# Patient Record
Sex: Male | Born: 1935 | Race: White | Hispanic: No | Smoking: Former smoker
Health system: Southern US, Community
[De-identification: ages and names within clinical notes are randomized; demographics above are authoritative.]

## PROBLEM LIST (undated history)

## (undated) DIAGNOSIS — F419 Anxiety disorder, unspecified: Secondary | ICD-10-CM

## (undated) DIAGNOSIS — H269 Unspecified cataract: Secondary | ICD-10-CM

## (undated) DIAGNOSIS — F329 Major depressive disorder, single episode, unspecified: Secondary | ICD-10-CM

## (undated) DIAGNOSIS — F32A Depression, unspecified: Secondary | ICD-10-CM

## (undated) DIAGNOSIS — I10 Essential (primary) hypertension: Secondary | ICD-10-CM

## (undated) DIAGNOSIS — J439 Emphysema, unspecified: Secondary | ICD-10-CM

## (undated) HISTORY — DX: Major depressive disorder, single episode, unspecified: F32.9

## (undated) HISTORY — PX: LEG SURGERY: SHX1003

## (undated) HISTORY — DX: Anxiety disorder, unspecified: F41.9

## (undated) HISTORY — PX: HIP SURGERY: SHX245

## (undated) HISTORY — DX: Essential (primary) hypertension: I10

## (undated) HISTORY — DX: Depression, unspecified: F32.A

## (undated) HISTORY — PX: HERNIA REPAIR: SHX51

## (undated) HISTORY — DX: Unspecified cataract: H26.9

## (undated) HISTORY — DX: Emphysema, unspecified: J43.9

---

## 1998-05-17 ENCOUNTER — Emergency Department (HOSPITAL_COMMUNITY): Admission: EM | Admit: 1998-05-17 | Discharge: 1998-05-17 | Payer: Self-pay | Admitting: Emergency Medicine

## 1998-05-23 ENCOUNTER — Emergency Department (HOSPITAL_COMMUNITY): Admission: EM | Admit: 1998-05-23 | Discharge: 1998-05-23 | Payer: Self-pay

## 2010-10-15 ENCOUNTER — Emergency Department (INDEPENDENT_AMBULATORY_CARE_PROVIDER_SITE_OTHER): Payer: Medicare Other

## 2010-10-15 ENCOUNTER — Emergency Department (HOSPITAL_BASED_OUTPATIENT_CLINIC_OR_DEPARTMENT_OTHER)
Admission: EM | Admit: 2010-10-15 | Discharge: 2010-10-15 | Disposition: A | Discharge status: Home / Self Care | Payer: Medicare Other | Attending: Emergency Medicine | Admitting: Emergency Medicine

## 2010-10-15 DIAGNOSIS — Z96649 Presence of unspecified artificial hip joint: Secondary | ICD-10-CM

## 2010-10-15 DIAGNOSIS — M25559 Pain in unspecified hip: Secondary | ICD-10-CM | POA: Insufficient documentation

## 2010-10-15 DIAGNOSIS — F17200 Nicotine dependence, unspecified, uncomplicated: Secondary | ICD-10-CM | POA: Insufficient documentation

## 2010-10-15 DIAGNOSIS — W19XXXA Unspecified fall, initial encounter: Secondary | ICD-10-CM

## 2010-10-15 DIAGNOSIS — J438 Other emphysema: Secondary | ICD-10-CM | POA: Insufficient documentation

## 2013-02-21 DIAGNOSIS — H524 Presbyopia: Secondary | ICD-10-CM | POA: Diagnosis not present

## 2013-02-21 DIAGNOSIS — H2510 Age-related nuclear cataract, unspecified eye: Secondary | ICD-10-CM | POA: Diagnosis not present

## 2013-02-21 DIAGNOSIS — H3530 Unspecified macular degeneration: Secondary | ICD-10-CM | POA: Diagnosis not present

## 2014-06-05 ENCOUNTER — Ambulatory Visit (INDEPENDENT_AMBULATORY_CARE_PROVIDER_SITE_OTHER): Payer: Medicare Other | Admitting: Medical

## 2014-06-05 ENCOUNTER — Ambulatory Visit (HOSPITAL_BASED_OUTPATIENT_CLINIC_OR_DEPARTMENT_OTHER)
Admission: RE | Admit: 2014-06-05 | Discharge: 2014-06-05 | Disposition: A | Discharge status: Home / Self Care | Payer: Medicare Other | Source: Ambulatory Visit | Attending: Medical | Admitting: Medical

## 2014-06-05 ENCOUNTER — Encounter: Payer: Self-pay | Admitting: Medical

## 2014-06-05 VITALS — BP 185/85 | HR 71 | Temp 98.2°F | Ht 69.0 in | Wt 121.0 lb

## 2014-06-05 DIAGNOSIS — I10 Essential (primary) hypertension: Secondary | ICD-10-CM | POA: Diagnosis not present

## 2014-06-05 DIAGNOSIS — J438 Other emphysema: Secondary | ICD-10-CM

## 2014-06-05 DIAGNOSIS — Z720 Tobacco use: Secondary | ICD-10-CM | POA: Diagnosis not present

## 2014-06-05 DIAGNOSIS — R918 Other nonspecific abnormal finding of lung field: Secondary | ICD-10-CM | POA: Diagnosis not present

## 2014-06-05 DIAGNOSIS — R0602 Shortness of breath: Secondary | ICD-10-CM | POA: Diagnosis not present

## 2014-06-05 DIAGNOSIS — Z87891 Personal history of nicotine dependence: Secondary | ICD-10-CM

## 2014-06-05 LAB — COMPREHENSIVE METABOLIC PANEL
ALBUMIN: 4.2 g/dL (ref 3.5–5.2)
ALK PHOS: 59 U/L (ref 39–117)
ALT: 10 U/L (ref 0–53)
AST: 20 U/L (ref 0–37)
BILIRUBIN TOTAL: 0.6 mg/dL (ref 0.2–1.2)
BUN: 22 mg/dL (ref 6–23)
CHLORIDE: 105 meq/L (ref 96–112)
CO2: 28 mEq/L (ref 19–32)
CREATININE: 0.96 mg/dL (ref 0.50–1.35)
Calcium: 9.5 mg/dL (ref 8.4–10.5)
Glucose, Bld: 85 mg/dL (ref 70–99)
Potassium: 4.9 mEq/L (ref 3.5–5.3)
SODIUM: 141 meq/L (ref 135–145)
TOTAL PROTEIN: 6.6 g/dL (ref 6.0–8.3)

## 2014-06-05 MED ORDER — BUDESONIDE-FORMOTEROL FUMARATE 160-4.5 MCG/ACT IN AERO: 2.0000 | INHALATION_SPRAY | Freq: Two times a day (BID) | RESPIRATORY_TRACT | Status: DC

## 2014-06-05 MED ORDER — LISINOPRIL 10 MG PO TABS: 10.0000 mg | ORAL_TABLET | Freq: Every day | ORAL | Status: DC

## 2014-06-05 NOTE — Assessment & Plan Note (Signed)
For you blood pressure, I will give lisinopril tabs and get cmp. Wold recommend at home otc cuff. Use and check 3-4 times a week. If at any point neurologic or cardiac symptoms then ED evaluation

## 2014-06-05 NOTE — Progress Notes (Signed)
Pre visit review using our clinic review tool, if applicable. No additional management support is needed unless otherwise documented below in the visit note. 

## 2014-06-05 NOTE — Patient Instructions (Signed)
For you blood pressure, I will give lisinopril tabs and get cmp. Wold recommend at home otc cuff. Use and check 3-4 times a week. If at any point neurologic or cardiac symptoms then ED evaluation  For your copd, I will rx symbicort and get cxr.  Follow up in 2 wks  bp check or as needed.  Follow up 1 month for fasting complete physical. Ask up front staff to give 45 minute appointment.

## 2014-06-05 NOTE — Assessment & Plan Note (Signed)
For your copd, I will rx symbicort and get cxr.

## 2014-06-05 NOTE — Progress Notes (Signed)
Subjective:    Patient ID: STANLY SI, male    DOB: 1928/10/06, 81 y.o.   MRN: [id number]  HPI   I have reviewed pt PMH, PSH, FH, Social History and Surgical History.  Pt in states his bp is elevated. Pt in December had his bp checked and was 170/100. His daughter is with him. She states gradualy increase systolic over the years. He does not go to visit provider  often in the past. Pt feels ok.On extensive review regarding cardiac and neurologic symptoms and denies all. See ROS.  Pt has copd known history. O2 saturation 90%. No significant acute changes recently with the way he breaths. On and off smoking for 60 yrs. He still smokes. 5-10 cigarettes a day.  Years ago period of anxiety and depression. Better now. Denies any years ago.   Pt rum and coke one a day.      Past Medical History  Diagnosis Date  . Anxiety   . Cataract   . Emphysema of lung   . Depression     Years ago not current per patient  . Hypertension     History   Social History  . Marital Status: Divorced    Spouse Name: N/A    Number of Children: N/A  . Years of Education: N/A   Occupational History  . Not on file.   Social History Main Topics  . Smoking status: Current Every Day Smoker  . Smokeless tobacco: Never Used  . Alcohol Use: 0.0 oz/week    0 Not specified per week  . Drug Use: Not on file  . Sexual Activity: No   Other Topics Concern  . Not on file   Social History Narrative  . No narrative on file    Past Surgical History  Procedure Laterality Date  . Leg surgery      Left  . Hip surgery      Joint replacement  . Hernia repair      rt side. year 2000.    Family History  Problem Relation Age of Onset  . GER disease Mother   . Heart disease Father     No Known Allergies  No current outpatient prescriptions on file prior to visit.   No current facility-administered medications on file prior to visit.    BP 185/85 mmHg  Pulse 71  Temp(Src) 98.2 F (36.8  C) (Oral)  Ht  (1.753 m)  Wt 121 lb (54.885 kg)  BMI 17.86 kg/m2  SpO2 90%        Review of Systems  Constitutional: Negative for fever, chills, diaphoresis, activity change and fatigue.  HENT: Negative for congestion, ear discharge, facial swelling, mouth sores, nosebleeds, postnasal drip and rhinorrhea.   Respiratory: Positive for shortness of breath. Negative for cough and chest tightness.        But baseline.  Cardiovascular: Negative for chest pain, palpitations and leg swelling.  Gastrointestinal: Negative for nausea, vomiting and abdominal pain.  Musculoskeletal: Negative for back pain, joint swelling, arthralgias, neck pain and neck stiffness.  Skin: Negative for pallor and rash.  Neurological: Negative for dizziness, tremors, seizures, syncope, facial asymmetry, speech difficulty, weakness, light-headedness, numbness and headaches.  Psychiatric/Behavioral: Negative for suicidal ideas, behavioral problems, confusion, sleep disturbance, dysphoric mood, decreased concentration and agitation. The patient is not nervous/anxious.        Objective:   Physical Exam   General Mental Status- Alert. General Appearance- Not in acute distress.   Skin General: Color-  Normal Color. Moisture- Normal Moisture.  Neck Carotid Arteries- Normal color. Moisture- Normal Moisture. No carotid bruits. No JVD.  Chest and Lung Exam Auscultation: Breath Sounds:- even and unlabored but shallow  Cardiovascular Auscultation:Rythm- Regular. Murmurs & Other Heart Sounds:Auscultation of the heart reveals- No Murmurs.  Abdomen Inspection:-Inspeection Normal. Palpation/Percussion:Note:No mass. Palpation and Percussion of the abdomen reveal- Non Tender, Non Distended + BS, no rebound or guarding.    Neurologic Cranial Nerve exam:- CN III-XII intact(No nystagmus), symmetric smile. Drift Test:- No drift. Romberg Exam:- Negative.  Heal to Toe Gait exam:-Normal. Finger to Nose:-  Normal/Intact Strength:- 5/5 equal and symmetric strength both upper and lower extremities.       Assessment & Plan:

## 2014-06-07 ENCOUNTER — Telehealth: Payer: Self-pay | Admitting: Medical

## 2014-06-07 MED ORDER — CLARITHROMYCIN ER 500 MG PO TB24: 1000.0000 mg | ORAL_TABLET | Freq: Every day | ORAL | Status: DC

## 2014-06-07 NOTE — Telephone Encounter (Signed)
Send antibiotic in due to abnormal opacity.

## 2014-06-09 ENCOUNTER — Other Ambulatory Visit: Payer: Self-pay

## 2014-06-09 MED ORDER — CLARITHROMYCIN ER 500 MG PO TB24: 1000.0000 mg | ORAL_TABLET | Freq: Every day | ORAL | Status: DC

## 2014-06-24 ENCOUNTER — Encounter: Payer: Self-pay | Admitting: Medical

## 2014-06-24 ENCOUNTER — Ambulatory Visit (INDEPENDENT_AMBULATORY_CARE_PROVIDER_SITE_OTHER): Payer: Medicare Other | Admitting: Medical

## 2014-06-24 ENCOUNTER — Ambulatory Visit (HOSPITAL_BASED_OUTPATIENT_CLINIC_OR_DEPARTMENT_OTHER)
Admission: RE | Admit: 2014-06-24 | Discharge: 2014-06-24 | Disposition: A | Discharge status: Home / Self Care | Payer: Medicare Other | Source: Ambulatory Visit | Attending: Medical | Admitting: Medical

## 2014-06-24 VITALS — BP 160/80 | HR 77 | Temp 97.6°F | Ht 69.0 in | Wt 125.2 lb

## 2014-06-24 DIAGNOSIS — J438 Other emphysema: Secondary | ICD-10-CM | POA: Diagnosis not present

## 2014-06-24 DIAGNOSIS — J189 Pneumonia, unspecified organism: Secondary | ICD-10-CM | POA: Diagnosis not present

## 2014-06-24 DIAGNOSIS — I10 Essential (primary) hypertension: Secondary | ICD-10-CM | POA: Diagnosis not present

## 2014-06-24 DIAGNOSIS — R918 Other nonspecific abnormal finding of lung field: Secondary | ICD-10-CM | POA: Diagnosis not present

## 2014-06-24 NOTE — Progress Notes (Signed)
Subjective:    Patient ID: Tom Duarte, male    DOB: 03-Mar-1929, 81 y.o.   MRN: [id number]  HPI    Pt in for follow up. His blood pressure is better. Last time was 185/85 last time initial check with my nurse was 160/80. No cardiac or neurologic signs or symptoms. No side effects from bp med.   Pt states his breathing stable. No wheezing or sob today. Pt was given symbicort on last visit.   Pt on last visit had cxr. He had abnormal opacity. I rx'd biaxin and he took. We will repeat cxr today. Pt denies fevers, chills or sweats. Occasional productive cough. Pt still smokes 5-7 cigarettes a day. Long term smoker.       Review of Systems  Constitutional: Negative for fever, chills, diaphoresis, activity change and fatigue.  Respiratory: Positive for cough. Negative for chest tightness and shortness of breath.        Rare occasional minimal cough. Minimally productive.   Cardiovascular: Negative for chest pain, palpitations and leg swelling.  Gastrointestinal: Negative for nausea, vomiting and abdominal pain.  Musculoskeletal: Negative for neck pain and neck stiffness.  Neurological: Negative for dizziness, tremors, seizures, syncope, facial asymmetry, speech difficulty, weakness, light-headedness, numbness and headaches.  Psychiatric/Behavioral: Negative for behavioral problems, confusion and agitation. The patient is not nervous/anxious.     Past Medical History  Diagnosis Date  . Anxiety   . Cataract   . Emphysema of lung   . Depression     Years ago not current per patient  . Hypertension     History   Social History  . Marital Status: Divorced    Spouse Name: N/A    Number of Children: N/A  . Years of Education: N/A   Occupational History  . Not on file.   Social History Main Topics  . Smoking status: Current Every Day Smoker  . Smokeless tobacco: Never Used  . Alcohol Use: 0.0 oz/week    0 Not specified per week  . Drug Use: Not on file  . Sexual  Activity: No   Other Topics Concern  . Not on file   Social History Narrative    Past Surgical History  Procedure Laterality Date  . Leg surgery      Left  . Hip surgery      Joint replacement  . Hernia repair      rt side. year 2000.    Family History  Problem Relation Age of Onset  . GER disease Mother   . Heart disease Father     No Known Allergies  Current Outpatient Prescriptions on File Prior to Visit  Medication Sig Dispense Refill  . aspirin 325 MG tablet Take 325 mg by mouth daily. 2 daily    . budesonide-formoterol (SYMBICORT) 160-4.5 MCG/ACT inhaler Inhale 2 puffs into the lungs 2 (two) times daily. 1 Inhaler 3  . clarithromycin (BIAXIN XL) 500 MG 24 hr tablet Take 2 tablets (1,000 mg total) by mouth daily. 20 tablet 0  . clarithromycin (BIAXIN XL) 500 MG 24 hr tablet Take 2 tablets (1,000 mg total) by mouth daily. 20 tablet 0  . lisinopril (PRINIVIL,ZESTRIL) 10 MG tablet Take 1 tablet (10 mg total) by mouth daily. 30 tablet 1   No current facility-administered medications on file prior to visit.    BP 160/80 mmHg  Pulse 77  Temp(Src) 97.6 F (36.4 C) (Oral)  Ht  (1.753 m)  Wt 125 lb 3.2 oz (56.79  kg)  BMI 18.48 kg/m2  SpO2 97%       Objective:   Physical Exam   General Mental Status- Alert. General Appearance- Not in acute distress.   Skin General: Color- Normal Color. Moisture- Normal Moisture.  Neck Carotid Arteries- Normal color. Moisture- Normal Moisture. No carotid bruits. No JVD.  Chest and Lung Exam Auscultation: Breath Sounds:-even unlabored but shallow respirations.  Cardiovascular Auscultation:Rythm- Regular. Murmurs & Other Heart Sounds:Auscultation of the heart reveals- No Murmurs.   Neurologic Cranial Nerve exam:- CN III-XII intact(No nystagmus), symmetric smile. Drift Test:- No drift. Romberg Exam:- Negative.  Heal to Toe Gait exam:-Normal. Finger to Nose:- Normal/Intact Strength:- 5/5 equal and symmetric  strength both upper and lower extremities.       Assessment & Plan:

## 2014-06-24 NOTE — Progress Notes (Signed)
Pre visit review using our clinic review tool, if applicable. No additional management support is needed unless otherwise documented below in the visit note. 

## 2014-06-24 NOTE — Assessment & Plan Note (Signed)
By cxr recent pneumonia. He has been on biaxin. And will repeat cxr today to see if opacity has cleared. If area has not cleared then will get ct of the chest.

## 2014-06-24 NOTE — Assessment & Plan Note (Signed)
Stable and continue symbicrot.

## 2014-06-24 NOTE — Assessment & Plan Note (Addendum)
Your blood pressure is better today. I think it would be beneficial for you to get otc bp cuff. Check bp daily and give us update in 3 wks regarding the readings. I want to know the trend on daily basis to determine if we need to increase dose of medication.   Follow up in 3 weeks.

## 2014-06-24 NOTE — Patient Instructions (Signed)
HTN (hypertension) Your blood pressure is better today. I think it would be beneficial for you to get otc bp cuff. Check bp daily and give us update in 3 wks regarding the readings. I want to know the trend on daily basis to determine if we need to increase dose of medication.   Follow up in 3 weeks.     Other emphysema Stable and continue symbicrot.   CAP (community acquired pneumonia) By cxr recent pneumonia. He has been on biaxin. And will repeat cxr today to see if opacity has cleared. If area has not cleared then will get ct of the chest.    If any cardiac or neurologic signs or symptoms then ED evaluation.

## 2014-06-25 ENCOUNTER — Telehealth: Payer: Self-pay | Admitting: Medical

## 2014-06-25 DIAGNOSIS — R918 Other nonspecific abnormal finding of lung field: Secondary | ICD-10-CM

## 2014-06-25 DIAGNOSIS — F17200 Nicotine dependence, unspecified, uncomplicated: Secondary | ICD-10-CM

## 2014-06-25 NOTE — Telephone Encounter (Signed)
Appt scheduled for 2/12

## 2014-06-25 NOTE — Telephone Encounter (Signed)
Advised patient of repeat CXR and results. Told order placed for CT of Chest. States he will come and have done.

## 2014-06-25 NOTE — Telephone Encounter (Signed)
Lung opacity is unchanged. So will order ct per radiologist recommendation.

## 2014-06-27 ENCOUNTER — Ambulatory Visit (HOSPITAL_BASED_OUTPATIENT_CLINIC_OR_DEPARTMENT_OTHER): Admission: RE | Admit: 2014-06-27 | Payer: Medicare Other | Source: Ambulatory Visit

## 2014-06-30 ENCOUNTER — Other Ambulatory Visit: Payer: Self-pay | Admitting: Family Medicine

## 2014-06-30 ENCOUNTER — Ambulatory Visit (HOSPITAL_BASED_OUTPATIENT_CLINIC_OR_DEPARTMENT_OTHER)
Admission: RE | Admit: 2014-06-30 | Discharge: 2014-06-30 | Disposition: A | Discharge status: Home / Self Care | Payer: Medicare Other | Source: Ambulatory Visit | Attending: Medical | Admitting: Medical

## 2014-06-30 DIAGNOSIS — R918 Other nonspecific abnormal finding of lung field: Secondary | ICD-10-CM

## 2014-06-30 DIAGNOSIS — F17200 Nicotine dependence, unspecified, uncomplicated: Secondary | ICD-10-CM

## 2014-06-30 DIAGNOSIS — J439 Emphysema, unspecified: Secondary | ICD-10-CM | POA: Diagnosis not present

## 2014-06-30 DIAGNOSIS — R911 Solitary pulmonary nodule: Secondary | ICD-10-CM | POA: Insufficient documentation

## 2014-07-01 ENCOUNTER — Telehealth: Payer: Self-pay | Admitting: Medical

## 2014-07-01 NOTE — Telephone Encounter (Signed)
I called pt and no voice mail box set up. So could not leave a message for pt. Only one number to call.

## 2014-08-13 ENCOUNTER — Ambulatory Visit (INDEPENDENT_AMBULATORY_CARE_PROVIDER_SITE_OTHER): Payer: Medicare Other | Admitting: Medical

## 2014-08-13 ENCOUNTER — Telehealth: Payer: Self-pay | Admitting: Medical

## 2014-08-13 ENCOUNTER — Encounter: Payer: Self-pay | Admitting: Medical

## 2014-08-13 VITALS — BP 145/88 | HR 64 | Temp 97.7°F | Ht 69.0 in | Wt 124.4 lb

## 2014-08-13 DIAGNOSIS — I10 Essential (primary) hypertension: Secondary | ICD-10-CM

## 2014-08-13 DIAGNOSIS — J438 Other emphysema: Secondary | ICD-10-CM | POA: Diagnosis not present

## 2014-08-13 MED ORDER — LISINOPRIL 10 MG PO TABS: 10.0000 mg | ORAL_TABLET | Freq: Every day | ORAL | Status: DC

## 2014-08-13 NOTE — Progress Notes (Signed)
Subjective:    Patient ID: Tom Duarte, male    DOB: 06/22/1928, 81 y.o.   MRN: [id number]  HPI   Pt in for follow up. Pt states he bought a machine and readings that his daughter go were 136/63 and 141/55. Pt ran out of blood pressure medicine this weekend. 4 days since he ran out of medicine lisinopril. No cardiac or neuro symptoms. Initially bp readings by nurse were 172/82 and 167/83. Then came down to 154/90 on last lpn bp check.  Pt breathing recently stable. He is on symbicort. Hx of copd.    Review of Systems  Constitutional: Negative for fever, chills, diaphoresis, activity change and fatigue.  Respiratory: Negative for cough, chest tightness and shortness of breath.   Cardiovascular: Negative for chest pain, palpitations and leg swelling.  Gastrointestinal: Negative for nausea, vomiting and abdominal pain.  Musculoskeletal: Negative for neck pain and neck stiffness.  Neurological: Negative for dizziness, tremors, seizures, syncope, facial asymmetry, speech difficulty, weakness, light-headedness, numbness and headaches.  Psychiatric/Behavioral: Negative for behavioral problems, confusion and agitation. The patient is not nervous/anxious.    Past Medical History  Diagnosis Date  . Anxiety   . Cataract   . Emphysema of lung   . Depression     Years ago not current per patient  . Hypertension     History   Social History  . Marital Status: Divorced    Spouse Name: N/A  . Number of Children: N/A  . Years of Education: N/A   Occupational History  . Not on file.   Social History Main Topics  . Smoking status: Current Every Day Smoker  . Smokeless tobacco: Never Used  . Alcohol Use: 0.0 oz/week    0 Standard drinks or equivalent per week  . Drug Use: Not on file  . Sexual Activity: No   Other Topics Concern  . Not on file   Social History Narrative    Past Surgical History  Procedure Laterality Date  . Leg surgery      Left  . Hip surgery     Joint replacement  . Hernia repair      rt side. year 2000.    Family History  Problem Relation Age of Onset  . GER disease Mother   . Heart disease Father     No Known Allergies  Current Outpatient Prescriptions on File Prior to Visit  Medication Sig Dispense Refill  . aspirin 325 MG tablet Take 325 mg by mouth daily. 2 daily    . budesonide-formoterol (SYMBICORT) 160-4.5 MCG/ACT inhaler Inhale 2 puffs into the lungs 2 (two) times daily. 1 Inhaler 3  . lisinopril (PRINIVIL,ZESTRIL) 10 MG tablet Take 1 tablet (10 mg total) by mouth daily. 30 tablet 1   No current facility-administered medications on file prior to visit.    BP 145/88 mmHg  Pulse 64  Temp(Src) 97.7 F (36.5 C) (Oral)  Ht  (1.753 m)  Wt 124 lb 6.4 oz (56.427 kg)  BMI 18.36 kg/m2  SpO2 100%       Objective:   Physical Exam  General Mental Status- Alert. General Appearance- Not in acute distress.   Skin General: Color- Normal Color. Moisture- Normal Moisture.  Neck Carotid Arteries- Normal color. Moisture- Normal Moisture. No carotid bruits. No JVD.  Chest and Lung Exam Auscultation: Breath Sounds:-Normal. CTA.  Cardiovascular Auscultation:Rythm- Regular, Rate, Rhythm. Murmurs & Other Heart Sounds:Auscultation of the heart reveals- No Murmurs.  Abdomen Inspection:-Inspeection Normal. Palpation/Percussion:Note:No mass. Palpation  and Percussion of the abdomen reveal- Non Tender, Non Distended + BS, no rebound or guarding.    Neurologic Cranial Nerve exam:- CN III-XII intact(No nystagmus), symmetric smile. Drift Test:- No drift. Romberg Exam:- Negative.  Heal to Toe Gait exam:-Normal. Finger to Nose:- Normal/Intact Strength:- 5/5 equal and symmetric strength both upper and lower extremities.      Assessment & Plan:

## 2014-08-13 NOTE — Telephone Encounter (Signed)
emmi mailed  °

## 2014-08-13 NOTE — Assessment & Plan Note (Signed)
Pt bp little high today when I checked. High on initial readings with lpn. Pt is off of meds. Refilled his lisinopril today.

## 2014-08-13 NOTE — Progress Notes (Signed)
Pre visit review using our clinic review tool, if applicable. No additional management support is needed unless otherwise documented below in the visit note. 

## 2014-08-13 NOTE — Patient Instructions (Addendum)
HTN (hypertension) Pt bp little high today when I checked. High on initial readings with lpn. Pt is off of meds. Refilled his lisinopril today.       Other emphysema Pt has symbicort and doing well with. Breathing stable. Continue.     Follow up in 3 months for wellness exam.Please schedule a 45 minute appointment and in the morning. Come in fasting

## 2014-08-13 NOTE — Assessment & Plan Note (Signed)
Pt has symbicort and doing well with. Breathing stable. Continue.

## 2015-01-12 ENCOUNTER — Telehealth: Payer: Self-pay | Admitting: Medical

## 2015-01-12 DIAGNOSIS — R911 Solitary pulmonary nodule: Secondary | ICD-10-CM

## 2015-01-12 NOTE — Telephone Encounter (Signed)
Ct chest for lung nodule.

## 2015-02-04 ENCOUNTER — Ambulatory Visit (HOSPITAL_BASED_OUTPATIENT_CLINIC_OR_DEPARTMENT_OTHER)
Admission: RE | Admit: 2015-02-04 | Discharge: 2015-02-04 | Disposition: A | Discharge status: Home / Self Care | Payer: Medicare Other | Source: Ambulatory Visit | Attending: Medical | Admitting: Medical

## 2015-02-04 DIAGNOSIS — R911 Solitary pulmonary nodule: Secondary | ICD-10-CM

## 2015-02-04 DIAGNOSIS — J439 Emphysema, unspecified: Secondary | ICD-10-CM | POA: Diagnosis not present

## 2015-02-04 DIAGNOSIS — I2510 Atherosclerotic heart disease of native coronary artery without angina pectoris: Secondary | ICD-10-CM | POA: Insufficient documentation

## 2015-02-05 ENCOUNTER — Telehealth: Payer: Self-pay | Admitting: Medical

## 2015-02-05 NOTE — Telephone Encounter (Signed)
Printed a copy of the results and will try and talk to pt.

## 2015-02-05 NOTE — Telephone Encounter (Signed)
Pt called back. He said not to call. He is going to stop in today for info from you. He said he has time to wait and he'll talk to you face to face.

## 2015-02-05 NOTE — Telephone Encounter (Signed)
Pt came in office and talked with provider.

## 2015-03-03 ENCOUNTER — Telehealth: Payer: Self-pay | Admitting: Medical

## 2015-03-03 NOTE — Telephone Encounter (Signed)
Caller name: Onalee HuaDavid Relation to HQ:IONGpt:self Call back number: [phone] Pharmacy:[id number] [address] [address] - [address]  Reason for call:  Pt came in office stating has an appt for Mar 18, 2015 and is taking med Lisinopril 10 mg 1 tablet every day and is wanting to know if he is needing to continue meds until coming to appt, if so he is going to need it to be refilled. Please advise.

## 2015-03-04 MED ORDER — LISINOPRIL 10 MG PO TABS: 10.0000 mg | ORAL_TABLET | Freq: Every day | ORAL | Status: DC

## 2015-03-04 NOTE — Telephone Encounter (Signed)
Pt will need to stay on medication from previous note.

## 2015-03-18 ENCOUNTER — Ambulatory Visit (INDEPENDENT_AMBULATORY_CARE_PROVIDER_SITE_OTHER): Payer: Medicare Other | Admitting: Medical

## 2015-03-18 ENCOUNTER — Encounter: Payer: Self-pay | Admitting: Medical

## 2015-03-18 VITALS — BP 126/78 | HR 84 | Temp 97.7°F | Ht 69.0 in | Wt 114.0 lb

## 2015-03-18 DIAGNOSIS — I10 Essential (primary) hypertension: Secondary | ICD-10-CM | POA: Diagnosis not present

## 2015-03-18 DIAGNOSIS — E875 Hyperkalemia: Secondary | ICD-10-CM | POA: Diagnosis not present

## 2015-03-18 DIAGNOSIS — J438 Other emphysema: Secondary | ICD-10-CM | POA: Diagnosis not present

## 2015-03-18 MED ORDER — LISINOPRIL 10 MG PO TABS: 10.0000 mg | ORAL_TABLET | Freq: Every day | ORAL | Status: DC

## 2015-03-18 NOTE — Progress Notes (Signed)
Pre visit review using our clinic review tool, if applicable. No additional management support is needed unless otherwise documented below in the visit note. 

## 2015-03-18 NOTE — Patient Instructions (Signed)
Blood pressure is well controlled. Will refill his lisinopril. Will get cbc and cmp.  For your history of smoking, hx of copd and pulmonary htn reported likely on ct report, I will refer you to pulmonologist.  Follow up in 4 months or as needed.

## 2015-03-18 NOTE — Progress Notes (Signed)
Subjective:    Patient ID: Tom RossettiDavid S Duarte, male    DOB: 1929/03/17, 81 y.o.   MRN: [id number]  HPI  Pt blood pressure is good today. Pt blood pressure best it has been.  Pt has no cardiac or neurologic signs or symptoms.   No side effects from bp medications  Last cmp in January showed normal lab.  Pt states he only took symbicort one time. He states it did not work so he stopped. Pt long history of smoking. Pt smokes pack every 2 days. He started smoking in early 20's.  Pt had some emphysema by ct. Some pulmonary htn probable on ct report.     Review of Systems  Constitutional: Negative for fever, chills, diaphoresis, activity change and fatigue.  Respiratory: Negative for cough, chest tightness and shortness of breath.   Cardiovascular: Negative for chest pain, palpitations and leg swelling.  Gastrointestinal: Negative for nausea, vomiting and abdominal pain.  Musculoskeletal: Negative for neck pain and neck stiffness.  Neurological: Negative for dizziness, tremors, seizures, syncope, facial asymmetry, speech difficulty, weakness, light-headedness, numbness and headaches.  Psychiatric/Behavioral: Negative for behavioral problems, confusion and agitation. The patient is not nervous/anxious.    Past Medical History  Diagnosis Date  . Anxiety   . Cataract   . Emphysema of lung (HCC)   . Depression     Years ago not current per patient  . Hypertension     Social History   Social History  . Marital Status: Divorced    Spouse Name: N/A  . Number of Children: N/A  . Years of Education: N/A   Occupational History  . Not on file.   Social History Main Topics  . Smoking status: Current Every Day Smoker  . Smokeless tobacco: Never Used  . Alcohol Use: 0.0 oz/week    0 Standard drinks or equivalent per week  . Drug Use: Not on file  . Sexual Activity: No   Other Topics Concern  . Not on file   Social History Narrative    Past Surgical History  Procedure  Laterality Date  . Leg surgery      Left  . Hip surgery      Joint replacement  . Hernia repair      rt side. year 2000.    Family History  Problem Relation Age of Onset  . GER disease Mother   . Heart disease Father     No Known Allergies  Current Outpatient Prescriptions on File Prior to Visit  Medication Sig Dispense Refill  . aspirin 325 MG tablet Take 325 mg by mouth daily. 2 daily    . lisinopril (PRINIVIL,ZESTRIL) 10 MG tablet Take 1 tablet (10 mg total) by mouth daily. 30 tablet 0   No current facility-administered medications on file prior to visit.    BP 126/78 mmHg  Pulse 84  Temp(Src) 97.7 F (36.5 C) (Oral)  Ht 5\' 9"  (1.753 m)  Wt 114 lb (51.71 kg)  BMI 16.83 kg/m2  SpO2 97%       Objective:   Physical Exam  General Mental Status- Alert. General Appearance- Not in acute distress.   Skin General: Color- Normal Color. Moisture- Normal Moisture.  Neck Carotid Arteries- Normal color. Moisture- Normal Moisture. No carotid bruits. No JVD.  Chest and Lung Exam Auscultation: Breath Sounds:-Normal.  Cardiovascular Auscultation:Rythm- Regular. Murmurs & Other Heart Sounds:Auscultation of the heart reveals- No Murmurs.  Abdomen Inspection:-Inspeection Normal. Palpation/Percussion:Note:No mass. Palpation and Percussion of the abdomen reveal- Non Tender,  Non Distended + BS, no rebound or guarding.    Neurologic Cranial Nerve exam:- CN III-XII intact(No nystagmus), symmetric smile. Strength:- 5/5 equal and symmetric strength both upper and lower extremities.      Assessment & Plan:  Blood pressure is well controlled. Will refill his lisinopril. Will get cbc and cmp.  For your history of smoking, hx of copd and pulmonary htn reported likely on ct report, I will refer you to pulmonologist.  Follow up in 4 months or as needed.

## 2015-03-19 LAB — CBC WITH DIFFERENTIAL/PLATELET
BASOS PCT: 1.2 % (ref 0.0–3.0)
Basophils Absolute: 0.1 10*3/uL (ref 0.0–0.1)
EOS ABS: 0.3 10*3/uL (ref 0.0–0.7)
Eosinophils Relative: 3.6 % (ref 0.0–5.0)
HEMATOCRIT: 41 % (ref 39.0–52.0)
Hemoglobin: 13.6 g/dL (ref 13.0–17.0)
LYMPHS ABS: 1.6 10*3/uL (ref 0.7–4.0)
LYMPHS PCT: 19.6 % (ref 12.0–46.0)
MCHC: 33.2 g/dL (ref 30.0–36.0)
MCV: 91.9 fl (ref 78.0–100.0)
MONO ABS: 0.5 10*3/uL (ref 0.1–1.0)
Monocytes Relative: 5.7 % (ref 3.0–12.0)
NEUTROS ABS: 5.7 10*3/uL (ref 1.4–7.7)
Neutrophils Relative %: 69.9 % (ref 43.0–77.0)
PLATELETS: 281 10*3/uL (ref 150.0–400.0)
RBC: 4.46 Mil/uL (ref 4.22–5.81)
RDW: 14.5 % (ref 11.5–15.5)
WBC: 8.1 10*3/uL (ref 4.0–10.5)

## 2015-03-19 LAB — COMPREHENSIVE METABOLIC PANEL
ALT: 10 U/L (ref 0–53)
AST: 18 U/L (ref 0–37)
Albumin: 4.3 g/dL (ref 3.5–5.2)
Alkaline Phosphatase: 51 U/L (ref 39–117)
BUN: 23 mg/dL (ref 6–23)
CALCIUM: 10 mg/dL (ref 8.4–10.5)
CHLORIDE: 103 meq/L (ref 96–112)
CO2: 30 meq/L (ref 19–32)
CREATININE: 1.02 mg/dL (ref 0.40–1.50)
GFR: 73.95 mL/min (ref >60.00)
Glucose, Bld: 91 mg/dL (ref 70–99)
Potassium: 5.3 mEq/L — ABNORMAL HIGH (ref 3.5–5.1)
Sodium: 141 mEq/L (ref 135–145)
Total Bilirubin: 0.6 mg/dL (ref 0.2–1.2)
Total Protein: 7.2 g/dL (ref 6.0–8.3)

## 2015-03-20 NOTE — Addendum Note (Signed)
Addended by: Neldon LabellaMABE, HOLDEN S on: 03/20/2015 01:20 PM   Modules accepted: Orders

## 2015-12-30 ENCOUNTER — Encounter: Payer: Self-pay | Admitting: Medical

## 2015-12-30 ENCOUNTER — Ambulatory Visit (HOSPITAL_BASED_OUTPATIENT_CLINIC_OR_DEPARTMENT_OTHER)
Admission: RE | Admit: 2015-12-30 | Discharge: 2015-12-30 | Disposition: A | Discharge status: Home / Self Care | Payer: Medicare Other | Source: Ambulatory Visit | Attending: Medical | Admitting: Medical

## 2015-12-30 ENCOUNTER — Ambulatory Visit (INDEPENDENT_AMBULATORY_CARE_PROVIDER_SITE_OTHER): Payer: Medicare Other | Admitting: Medical

## 2015-12-30 ENCOUNTER — Telehealth: Payer: Self-pay | Admitting: Medical

## 2015-12-30 VITALS — BP 98/60 | HR 78 | Temp 97.8°F | Ht 69.0 in | Wt 109.4 lb

## 2015-12-30 DIAGNOSIS — I77810 Thoracic aortic ectasia: Secondary | ICD-10-CM | POA: Diagnosis not present

## 2015-12-30 DIAGNOSIS — R911 Solitary pulmonary nodule: Secondary | ICD-10-CM | POA: Insufficient documentation

## 2015-12-30 DIAGNOSIS — I10 Essential (primary) hypertension: Secondary | ICD-10-CM

## 2015-12-30 DIAGNOSIS — J948 Other specified pleural conditions: Secondary | ICD-10-CM | POA: Diagnosis not present

## 2015-12-30 DIAGNOSIS — F329 Major depressive disorder, single episode, unspecified: Secondary | ICD-10-CM | POA: Diagnosis not present

## 2015-12-30 DIAGNOSIS — J432 Centrilobular emphysema: Secondary | ICD-10-CM | POA: Diagnosis not present

## 2015-12-30 DIAGNOSIS — F411 Generalized anxiety disorder: Secondary | ICD-10-CM

## 2015-12-30 DIAGNOSIS — F32A Depression, unspecified: Secondary | ICD-10-CM

## 2015-12-30 DIAGNOSIS — J441 Chronic obstructive pulmonary disease with (acute) exacerbation: Secondary | ICD-10-CM | POA: Diagnosis not present

## 2015-12-30 LAB — CBC WITH DIFFERENTIAL/PLATELET
BASOS ABS: 0.1 10*3/uL (ref 0.0–0.1)
BASOS PCT: 0.6 % (ref 0.0–3.0)
EOS ABS: 0.1 10*3/uL (ref 0.0–0.7)
Eosinophils Relative: 1 % (ref 0.0–5.0)
HEMATOCRIT: 39.7 % (ref 39.0–52.0)
HEMOGLOBIN: 13.3 g/dL (ref 13.0–17.0)
LYMPHS PCT: 16.5 % (ref 12.0–46.0)
Lymphs Abs: 1.3 10*3/uL (ref 0.7–4.0)
MCHC: 33.4 g/dL (ref 30.0–36.0)
MCV: 90.3 fl (ref 78.0–100.0)
MONOS PCT: 5.8 % (ref 3.0–12.0)
Monocytes Absolute: 0.5 10*3/uL (ref 0.1–1.0)
Neutro Abs: 6.1 10*3/uL (ref 1.4–7.7)
Neutrophils Relative %: 76.1 % (ref 43.0–77.0)
Platelets: 324 10*3/uL (ref 150.0–400.0)
RBC: 4.4 Mil/uL (ref 4.22–5.81)
RDW: 14 % (ref 11.5–15.5)
WBC: 8 10*3/uL (ref 4.0–10.5)

## 2015-12-30 LAB — COMPREHENSIVE METABOLIC PANEL
ALBUMIN: 4.5 g/dL (ref 3.5–5.2)
ALK PHOS: 49 U/L (ref 39–117)
ALT: 12 U/L (ref 0–53)
AST: 21 U/L (ref 0–37)
BILIRUBIN TOTAL: 0.5 mg/dL (ref 0.2–1.2)
BUN: 25 mg/dL — AB (ref 6–23)
CO2: 29 mEq/L (ref 19–32)
CREATININE: 1.17 mg/dL (ref 0.40–1.50)
Calcium: 9.8 mg/dL (ref 8.4–10.5)
Chloride: 101 mEq/L (ref 96–112)
GFR: 63 mL/min (ref >60.00)
GLUCOSE: 106 mg/dL — AB (ref 70–99)
POTASSIUM: 4.6 meq/L (ref 3.5–5.1)
SODIUM: 139 meq/L (ref 135–145)
TOTAL PROTEIN: 7.1 g/dL (ref 6.0–8.3)

## 2015-12-30 MED ORDER — ALBUTEROL SULFATE HFA 108 (90 BASE) MCG/ACT IN AERS: 2.0000 | INHALATION_SPRAY | Freq: Four times a day (QID) | RESPIRATORY_TRACT | 2 refills | Status: DC | PRN

## 2015-12-30 MED ORDER — SERTRALINE HCL 50 MG PO TABS: 50.0000 mg | ORAL_TABLET | Freq: Every day | ORAL | 0 refills | Status: DC

## 2015-12-30 MED ORDER — LORAZEPAM 0.5 MG PO TABS: ORAL_TABLET | ORAL | 0 refills | Status: DC

## 2015-12-30 NOTE — Progress Notes (Signed)
Pre visit review using our clinic tool,if applicable. No additional management support is needed unless otherwise documented below in the visit note.  

## 2015-12-30 NOTE — Telephone Encounter (Signed)
Will notify Angela CoxGail Daughter of Mr. Samul DadaDahlin on ct of chest date. She prefers mondays.

## 2015-12-30 NOTE — Progress Notes (Signed)
Subjective:    Patient ID: Tom RossettiDavid S Duarte, male    DOB: [birth year], 82 y.o.   MRN: [id number]  HPI  Pt in for evaluation. Been almost year since I saw year.,  Pt is still on lisinopril. He had htn dx in the past. Pt states he has not been checking his bp for very long time. Initial bp MA took was low. I will recheck today.  Pt has history of copd. Pt states some wheezing at times walking. Pt in past thought symbicort did not help.  Pt also has some depression recently. He states some anxiety as well. In past when he fractured hip 82 yo he was depressed that was correlated with anxiety. Back then he was on medication for about 6 months to one year. Over past 2 weeks he got acute depression with some anxiety. Last 2 weeks sleep has been disturbed. Pt sister just moved. Pt spent a lot of time over at her house.      Review of Systems  Constitutional: Negative for chills, fatigue and fever.  HENT: Negative for congestion, postnasal drip and sinus pressure.   Respiratory: Negative for cough, chest tightness, shortness of breath and wheezing.   Cardiovascular: Negative for chest pain and palpitations.  Gastrointestinal: Negative for abdominal pain, constipation and vomiting.  Genitourinary: Negative for dysuria, flank pain and frequency.  Musculoskeletal: Negative for back pain.  Skin: Negative for rash.  Neurological: Negative for dizziness, facial asymmetry, speech difficulty, weakness and headaches.  Psychiatric/Behavioral: Positive for dysphoric mood and sleep disturbance. Negative for agitation, behavioral problems, self-injury and suicidal ideas. The patient is nervous/anxious.     Past Medical History:  Diagnosis Date  . Anxiety   . Cataract   . Depression    Years ago not current per patient  . Emphysema of lung (HCC)   . Hypertension      Social History   Social History  . Marital status: Divorced    Spouse name: N/A  . Number of children: N/A  . Years of  education: N/A   Occupational History  . Not on file.   Social History Main Topics  . Smoking status: Current Every Day Smoker  . Smokeless tobacco: Never Used  . Alcohol use 0.0 oz/week  . Drug use: Unknown  . Sexual activity: No   Other Topics Concern  . Not on file   Social History Narrative  . No narrative on file    Past Surgical History:  Procedure Laterality Date  . HERNIA REPAIR     rt side. year 2000.  Marland Kitchen. HIP SURGERY     Joint replacement  . LEG SURGERY     Left    Family History  Problem Relation Age of Onset  . GER disease Mother   . Heart disease Father     No Known Allergies  Current Outpatient Prescriptions on File Prior to Visit  Medication Sig Dispense Refill  . aspirin 325 MG tablet Take 325 mg by mouth daily. 2 daily    . lisinopril (PRINIVIL,ZESTRIL) 10 MG tablet Take 1 tablet (10 mg total) by mouth daily. 90 tablet 3   No current facility-administered medications on file prior to visit.     BP 103/65   Pulse 78   Temp 97.8 F (36.6 C) (Oral)   Ht 5\' 9"  (1.753 m)   Wt 109 lb 6.4 oz (49.6 kg)   SpO2 95%   BMI 16.16 kg/m       Objective:  Physical Exam  General Mental Status- Alert. General Appearance- Not in acute distress.   Skin General: Color- Normal Color. Moisture- Normal Moisture.  Neck Carotid Arteries- Normal color. Moisture- Normal Moisture. No carotid bruits. No JVD.  Chest and Lung Exam Auscultation: Breath Sounds:-Normal.  Cardiovascular Auscultation:Rythm- Regular. Murmurs & Other Heart Sounds:Auscultation of the heart reveals- No Murmurs.  Abdomen Inspection:-Inspeection Normal. Palpation/Percussion:Note:No mass. Palpation and Percussion of the abdomen reveal- Non Tender, Non Distended + BS, no rebound or guarding.  Neurologic Cranial Nerve exam:- CN III-XII intact(No nystagmus), symmetric smile. Strength:- 5/5 equal and symmetric strength both upper and lower extremities.      Assessment & Plan:    You have history of htn but your bp is actually very low right now. I will ask you hold your bp med today. If your bp increases to over 130/80 range then will start reduced dose of lisinopril to 5 mg a day.Will call you stat cbc and cmp.(will see if can find reason hypotension recenty.)   For copd will rx albuterol to help when you have wheezing.  For depression will rx sertraline low dose. Will also rx low dose ativan. But I ask daughter to make sure she stays with you over next 2-3 days. Want to avoid oversedation and fall. But understand you need something today.  Will place order for repeat ct to evaluate pulmonary nodule. If no one calls then call Victorino DikeJennifer for update on scheduling.  Follow of in 2 weeks or as needed  Marvelene Stoneberg, Ramon DredgeEdward, VF CorporationPA-C

## 2015-12-30 NOTE — Patient Instructions (Addendum)
You have history of htn but your bp is actually very low right now. I will ask you hold your bp med today. If your bp increases to over 130/80 range then will start reduced dose of lisinopril to 5 mg a day. Will call you stat cbc and cmp.(will see if can find reason hypotension recenty.)  For copd will rx albuterol to help when you have wheezing.  For depression will rx sertraline low dose. Will also rx low dose ativan. But I ask daughter to make sure she stays with you over next 2-3 days. Want to avoid oversedation and fall. But understand you need something today.  Will place order for repeat ct to evaluate pulmonary nodule. If no one calls then call Victorino DikeJennifer for update on scheduling.  Follow of in 2 weeks or as needed

## 2015-12-30 NOTE — Telephone Encounter (Signed)
Patient having CT scan now

## 2015-12-31 ENCOUNTER — Telehealth: Payer: Self-pay | Admitting: Medical

## 2015-12-31 MED ORDER — LISINOPRIL 5 MG PO TABS: 5.0000 mg | ORAL_TABLET | Freq: Every day | ORAL | 0 refills | Status: DC

## 2015-12-31 NOTE — Telephone Encounter (Signed)
Opened to send new rx to his pharmacy

## 2015-12-31 NOTE — Telephone Encounter (Signed)
Would you keep trying. Ideally call his daughter. [name] if no answer will eventually need to send letter. Regarding lab and ct chest results. Thanks.

## 2016-01-01 ENCOUNTER — Telehealth: Payer: Self-pay

## 2016-01-03 NOTE — Telephone Encounter (Signed)
Would you call and talk with daughter [name]. Her dad was given enough of ativan to get him until Monday. Let her know that even if he goes through med quicker than I instructed I can't dispense medication if he runs out sooner. Monday 21st is earliest day I could fill it. But I also want to discuss the situation of him taking too many meds. I was already concerned about potential side effect of medication that being sedation and increased risk for fall before he took more than recommended amount. Knowing his recent pattern of use makes me even more hesitant to write. If you could pass this along to daughter and let her know I will try to call her to discuss how to approach dispensing of his meds in safe manner.

## 2016-01-04 NOTE — Telephone Encounter (Signed)
Spoke with patients daughter regarding message from E. Saguier. States she will wait for his call.

## 2016-01-04 NOTE — Telephone Encounter (Signed)
Spoke with daughter

## 2016-01-05 ENCOUNTER — Telehealth: Payer: Self-pay | Admitting: Medical

## 2016-01-05 MED ORDER — DIAZEPAM 2 MG PO TABS: ORAL_TABLET | ORAL | 0 refills | Status: DC

## 2016-01-05 NOTE — Telephone Encounter (Signed)
I discussed with Tom Duarte his daughter today his anxiety. He is still anxious and took his ativan and is still on sertraline. Pt daughter want me to rx low dose valium.   I explained to pt daughter that I want her to lock up med and dispense it. Have her or other daughter visit dad and give med. If this is too much burden then will consider writing referral for home health. Follow up in 2 weeks to see how pt and daughter are handling giving/taking med. Benefit and risk explained regarding taking benzodiazepines.

## 2016-01-05 NOTE — Telephone Encounter (Signed)
I printed rx valium. If you remind me will sign and pt daughter [name] states will pick up rx on Thursday.

## 2016-01-15 ENCOUNTER — Ambulatory Visit (INDEPENDENT_AMBULATORY_CARE_PROVIDER_SITE_OTHER): Payer: Medicare Other | Admitting: Medical

## 2016-01-15 ENCOUNTER — Encounter: Payer: Self-pay | Admitting: Medical

## 2016-01-15 VITALS — BP 110/73 | HR 89 | Temp 97.6°F | Ht 69.0 in | Wt 104.0 lb

## 2016-01-15 DIAGNOSIS — Z720 Tobacco use: Secondary | ICD-10-CM

## 2016-01-15 DIAGNOSIS — J441 Chronic obstructive pulmonary disease with (acute) exacerbation: Secondary | ICD-10-CM

## 2016-01-15 DIAGNOSIS — R0600 Dyspnea, unspecified: Secondary | ICD-10-CM

## 2016-01-15 DIAGNOSIS — R634 Abnormal weight loss: Secondary | ICD-10-CM | POA: Diagnosis not present

## 2016-01-15 DIAGNOSIS — Z87891 Personal history of nicotine dependence: Secondary | ICD-10-CM

## 2016-01-15 DIAGNOSIS — Z79899 Other long term (current) drug therapy: Secondary | ICD-10-CM

## 2016-01-15 MED ORDER — BUDESONIDE-FORMOTEROL FUMARATE 160-4.5 MCG/ACT IN AERO: 2.0000 | INHALATION_SPRAY | Freq: Two times a day (BID) | RESPIRATORY_TRACT | 3 refills | Status: DC

## 2016-01-15 MED ORDER — ALBUTEROL SULFATE HFA 108 (90 BASE) MCG/ACT IN AERS: 2.0000 | INHALATION_SPRAY | Freq: Four times a day (QID) | RESPIRATORY_TRACT | 2 refills | Status: DC | PRN

## 2016-01-15 MED ORDER — SERTRALINE HCL 50 MG PO TABS: 50.0000 mg | ORAL_TABLET | Freq: Every day | ORAL | 0 refills | Status: DC

## 2016-01-15 NOTE — Progress Notes (Signed)
Pre visit review using our clinic tool,if applicable. No additional management support is needed unless otherwise documented below in the visit note.  

## 2016-01-15 NOTE — Patient Instructions (Addendum)
[facility].  Will refill diazepam on Tuesday when he is due for refill.  Will rx sertraline 50 mg today. 2 weeks worth.  Will refill symbicort and albuterol  Refer to pulmonlogist.  Ask to drink 2-3 ensure a day and eat more.  Plan to call Scott/son and discuss further [phone]  Follow up 10 days or as needed

## 2016-01-15 NOTE — Progress Notes (Signed)
Subjective:    Patient ID: Tom RossettiDavid S Varon, male    DOB: 1928-08-24, 82 y.o.   MRN: [id number]  HPI  Pt in for follow up and with son.  Pt had anxiety, depression and frustration recently.(last time this occurred was maybe 15 years ago.Usually around loss. Recent friend passed away and one  daughter moved away.  I had written sertraline  for him on 12-30-2015. He is already out and it appears he stacks his tabs and appears taking more than he should.   Also on 01-05-2016 I had written diazepam twice daily. Pt has 10 tablets left and I had written for 28 tabs.   Pt has recently lost weight since last visit.   Pt has been using inhaler more frequently.(pt referred to pulmonoligist and pt declined at that time. He told them to call back in 2 backs.) Made referral back in November.  A lot friend/neighbors have been cooking him foods but son questions if he has been eating much.    Review of Systems  Constitutional: Positive for unexpected weight change. Negative for chills, fatigue and fever.  Respiratory: Positive for wheezing. Negative for choking, chest tightness and shortness of breath.   Cardiovascular: Negative for chest pain and palpitations.  Gastrointestinal: Negative for abdominal pain, constipation, diarrhea, nausea and vomiting.  Endocrine: Negative for polydipsia and polyuria.  Genitourinary: Negative for dysuria.  Musculoskeletal: Negative for back pain and neck stiffness.  Skin: Negative for rash.  Neurological: Negative for facial asymmetry, speech difficulty, weakness and light-headedness.  Psychiatric/Behavioral: Positive for dysphoric mood. Negative for behavioral problems, self-injury, sleep disturbance and suicidal ideas. The patient is nervous/anxious.    Past Medical History:  Diagnosis Date  . Anxiety   . Cataract   . Depression    Years ago not current per patient  . Emphysema of lung (HCC)   . Hypertension      Social History   Social History  .  Marital status: Divorced    Spouse name: N/A  . Number of children: N/A  . Years of education: N/A   Occupational History  . Not on file.   Social History Main Topics  . Smoking status: Current Every Day Smoker  . Smokeless tobacco: Never Used  . Alcohol use 0.0 oz/week  . Drug use: Unknown  . Sexual activity: No   Other Topics Concern  . Not on file   Social History Narrative  . No narrative on file    Past Surgical History:  Procedure Laterality Date  . HERNIA REPAIR     rt side. year 2000.  Marland Kitchen. HIP SURGERY     Joint replacement  . LEG SURGERY     Left    Family History  Problem Relation Age of Onset  . GER disease Mother   . Heart disease Father     No Known Allergies  Current Outpatient Prescriptions on File Prior to Visit  Medication Sig Dispense Refill  . aspirin 325 MG tablet Take 325 mg by mouth daily. 2 daily    . diazepam (VALIUM) 2 MG tablet 1 tab po bid prn severe anxiety 28 tablet 0  . lisinopril (PRINIVIL,ZESTRIL) 5 MG tablet Take 1 tablet (5 mg total) by mouth daily. (Patient not taking: Reported on 01/15/2016) 30 tablet 0   No current facility-administered medications on file prior to visit.     BP 110/73   Pulse 89   Temp 97.6 F (36.4 C) (Oral)   Ht 5\' 9"  (1.753 m)  Wt 104 lb (47.2 kg)   SpO2 96%   BMI 15.36 kg/m       Objective:   Physical Exam  General- No acute distress. Pleasant patient.(thin appearing) Neck- Full range of motion, no jvd Lungs- Clear, even and unlabored. Heart- regular rate and rhythm. Neurologic- CNII- XII grossly intact. Abdomen- soft, nt, +bs, no rebound or guarding.  Back- no cva tenderness.       Assessment & Plan:  Will refer to [facility] home health to get assessment regarding home health.  Will refill diazepam on Tuesday when he is due for refill.  Will rx sertraline 50 mg today. 2 weeks worth.  Will refill symbicort and albuterol  Refer to pulmonlogist.  Ask to drink 2-3 ensure a day and  eat more.  Plan to call Scott/son and discuss further [phone]  Follow up 10 days or as needed

## 2016-01-19 ENCOUNTER — Telehealth: Payer: Self-pay | Admitting: Medical

## 2016-01-19 NOTE — Telephone Encounter (Signed)
Caller name:Scott Relationship to patient:son Can be reached:[phone] Pharmacy:  Reason for call:Was suppose to receive phone call from [place] over the weekend, never heard anything

## 2016-01-19 NOTE — Telephone Encounter (Signed)
I called pt son Lorin PicketScott and we discussed home health referral and pulmonology referral. When those are made will you give son Arline AspScot or [name] update on those appointments. Or have pulmonologist and homehealth call them to coordinate date. Last time pt declined appointment when specialist office called. Also I put in home health referral. Leonette MostChose [facility]. Then got fax back stating out of net work and needed to switch or get approved. [facility] Or do I need to we write referral completely? Let me know.   Thanks.

## 2016-01-20 DIAGNOSIS — F419 Anxiety disorder, unspecified: Secondary | ICD-10-CM | POA: Diagnosis not present

## 2016-01-20 DIAGNOSIS — F329 Major depressive disorder, single episode, unspecified: Secondary | ICD-10-CM | POA: Diagnosis not present

## 2016-01-20 NOTE — Telephone Encounter (Signed)
Ok I'll send to [facility] to see if they will accept patient

## 2016-01-22 ENCOUNTER — Telehealth: Payer: Self-pay | Admitting: Medical

## 2016-01-22 NOTE — Telephone Encounter (Signed)
Was curious. Do you know when pt [facility] visit is?

## 2016-01-22 NOTE — Telephone Encounter (Signed)
Thank [facility] for calling but pt is working with [facility]. Plans changed after referral made.

## 2016-01-22 NOTE — Telephone Encounter (Signed)
Georgianne FickKris -[facility] - [phone]- [phone]    She called in to get verbal orders for a medical social worker to assist pt with paperwork, forms, etc.

## 2016-01-25 DIAGNOSIS — F329 Major depressive disorder, single episode, unspecified: Secondary | ICD-10-CM | POA: Diagnosis not present

## 2016-01-25 DIAGNOSIS — F419 Anxiety disorder, unspecified: Secondary | ICD-10-CM | POA: Diagnosis not present

## 2016-01-25 NOTE — Telephone Encounter (Signed)
[facility] called back in to speak with someone in regards to pt. Informed her of the below. She says that they have started working with pt. She would like for someone to give her office a call back at [phone]

## 2016-01-25 NOTE — Telephone Encounter (Signed)
[facility] awaiting status

## 2016-01-26 ENCOUNTER — Telehealth: Payer: Self-pay | Admitting: Medical

## 2016-01-26 NOTE — Telephone Encounter (Signed)
Tom Duarte came into the office to confirm services with [facility] and to get the verbal for Social work. They are already providing services to the patient and [facility] has been [facility] on there end. [name] gave her the verbal and explain the confusion as documented below. She verbalized understanding.    KP

## 2016-01-26 NOTE — Telephone Encounter (Signed)
Caller name: Dot LanesKrista  Relation to pt: [facility] Call back number: [phone]   Reason for call:  Requesting verbal orders for medical social worker and would like to discuss some health concerns regarding patient. Please advise

## 2016-01-27 NOTE — Telephone Encounter (Signed)
Pt's son is not on DPR, so cannot release PHI to him. Attempted to call pt again, but no voicemail set up as previously noted by Clydie BraunKaren.

## 2016-01-27 NOTE — Telephone Encounter (Signed)
Returned call to BeaverKrista, multiple concerns. See below.   1) Please advise if okay to give verbal orders for medical social worker.   2) Pt is out of Zoloft. Last filled 01/15/16, #14. Family questions if he self-medicating and taking increased dose. Pt refuses to use a pill box. Currently he is taking his daughter's Zoloft prescription (cutting pills to get correct dose) since he is out.   3) He is almost out of ventolin. Dot LanesKrista states he uses it anytime he feels anxious. Notified her that he does have additional refills at pharmacy.   4) The pt continues to have decreased appetite and eats very little. He is drinking 1 Boost or less daily.  Pt lives alone and family checks on him everyday, but they are only there for a short time. [facility] that [facility] is feeling better, but told his family that he still feels just as bad. Pt has follow-up appt scheduled on Friday 01/29/16. Please advise if any additional interventions/recommendations prior to appt.

## 2016-01-27 NOTE — Telephone Encounter (Signed)
Another contact number is pt son Lorin PicketScott. Number is [phone]. Please call him as well for appoitment tomorrow am. Let Karel Jarvisbony know if you are succesful so patient can be given 30 minute appointment.   Let him or daughter know concerned he may be getting dehydrated by [facility]. He may need to be sent to ED if on my evaluation looks dehydrated. Want appointment tomorrow am.

## 2016-01-27 NOTE — Telephone Encounter (Signed)
Returned call to PriddyKrista and left message to return call.

## 2016-01-27 NOTE — Telephone Encounter (Signed)
Talked with [facility]. Authorized Education administratorsocial service evaluation. Asked her to try to get him in by Friday. Give me update when they will got over. (see her note she sent me.)  Also will you see if family member can get pt in tomorrow. Early in am 8 am(5130minute slo) or 8:30(30 minute) . Pt may be deydrated as well as other issues to address.   Please call and let me know if he can be seen. Early in am tommorrow

## 2016-01-27 NOTE — Telephone Encounter (Signed)
Talked to daughter gail after hours around 7pm. I explained why I want to see him tomorrow am. Concern for dehydration. He refused to come in tomorrow but  will keep Friday appointment. Asked at least go over to his house and make sure he eats and try to get in 3 ensure tomorrow. I still have concern if he appears dehydrated on Friday will need to send to ED for evaluation stat labs and IV hydration.

## 2016-01-27 NOTE — Telephone Encounter (Signed)
LEFT MESSAGE ON PATIENTS DAUGHTERS PHONE FOR A CALL BACK TO SCHEDULE APPOINTMENT FOR PATIENT IN THE AM. UNABLE TO LEAVE MESSAGE ON PATIENTS PHONE.

## 2016-01-27 NOTE — Telephone Encounter (Signed)
I did call Tom Duarte. Had to leave message. Explained based on conversation with Tom Duarte that her dad may be getting dehydrated based on the limited intake of only one ensure a day. Only drinking coffee as well(his trend has been weight loss). I explained I wanted to see tomorrow early am 8-9 am rather than waiting until Friday. Explained if dehydrate may need iv hydration through ED. I asked her to call Tom Duarte back at office number. Also left my cell phone number so she could call after hours.   Note also currently social services is going to go out to his house tomorrow as well.  Currently asked management to hold early am appointments with hope of getting him in early prior to social service eval.   Also called Tom Duarte to see when medical social service eval is. I want to coordinatte that visit with ours.  Did talk with Tom Duarte. She usually makes visits in afternoon. She has not contacted Tom Duarte or Tom Duarte yet.

## 2016-01-27 NOTE — Telephone Encounter (Signed)
Called [place] [facility]. States she just completed a phone call with Ramon DredgeEdward. Saguier.

## 2016-01-28 DIAGNOSIS — F419 Anxiety disorder, unspecified: Secondary | ICD-10-CM | POA: Diagnosis not present

## 2016-01-28 DIAGNOSIS — F329 Major depressive disorder, single episode, unspecified: Secondary | ICD-10-CM | POA: Diagnosis not present

## 2016-01-28 NOTE — Telephone Encounter (Signed)
Left message on patients daughters answering machine for appointment to be rescheduled.

## 2016-01-28 NOTE — Telephone Encounter (Signed)
Spoke with daughter about changing patients appointment time. Daughter states Current time at 1 pm is better for her.Not sure how message was misunderstood. Patient will keep scheduled time per daughter.

## 2016-01-28 NOTE — Telephone Encounter (Signed)
-----   Message from Elliot Gaultiffany M Bell sent at 01/27/2016  5:52 PM EDT ----- Regarding: returning call Contact: [phone] Caller name: Clarita CraneJean Nilsson  Relation to EX:BMWUXLKGpt:daughter  Call back [phone]   Reason for call:  Daughter returning your call regarding rescheduling Friday 01/29/16 at 1pm appointment. As per daughter can't bring him in tomorrow but can bring him in earlier on Friday. Please advise

## 2016-01-29 ENCOUNTER — Encounter: Payer: Self-pay | Admitting: Medical

## 2016-01-29 ENCOUNTER — Emergency Department (HOSPITAL_BASED_OUTPATIENT_CLINIC_OR_DEPARTMENT_OTHER): Payer: Medicare Other

## 2016-01-29 ENCOUNTER — Telehealth: Payer: Self-pay | Admitting: Medical

## 2016-01-29 ENCOUNTER — Ambulatory Visit (INDEPENDENT_AMBULATORY_CARE_PROVIDER_SITE_OTHER): Payer: Medicare Other | Admitting: Medical

## 2016-01-29 ENCOUNTER — Observation Stay (HOSPITAL_BASED_OUTPATIENT_CLINIC_OR_DEPARTMENT_OTHER)
Admission: EM | Admit: 2016-01-29 | Discharge: 2016-01-29 | Disposition: A | Discharge status: Home / Self Care | Payer: Medicare Other | Attending: Internal Medicine | Admitting: Internal Medicine

## 2016-01-29 ENCOUNTER — Encounter (HOSPITAL_BASED_OUTPATIENT_CLINIC_OR_DEPARTMENT_OTHER): Payer: Self-pay | Admitting: *Deleted

## 2016-01-29 VITALS — BP 105/68 | HR 91 | Temp 98.0°F | Ht 69.0 in | Wt 101.2 lb

## 2016-01-29 DIAGNOSIS — I10 Essential (primary) hypertension: Secondary | ICD-10-CM | POA: Diagnosis not present

## 2016-01-29 DIAGNOSIS — F17200 Nicotine dependence, unspecified, uncomplicated: Secondary | ICD-10-CM | POA: Diagnosis not present

## 2016-01-29 DIAGNOSIS — R627 Adult failure to thrive: Secondary | ICD-10-CM | POA: Diagnosis not present

## 2016-01-29 DIAGNOSIS — Z720 Tobacco use: Secondary | ICD-10-CM | POA: Diagnosis not present

## 2016-01-29 DIAGNOSIS — Z7982 Long term (current) use of aspirin: Secondary | ICD-10-CM | POA: Insufficient documentation

## 2016-01-29 DIAGNOSIS — F329 Major depressive disorder, single episode, unspecified: Secondary | ICD-10-CM | POA: Insufficient documentation

## 2016-01-29 DIAGNOSIS — E876 Hypokalemia: Secondary | ICD-10-CM | POA: Insufficient documentation

## 2016-01-29 DIAGNOSIS — I493 Ventricular premature depolarization: Secondary | ICD-10-CM | POA: Diagnosis not present

## 2016-01-29 DIAGNOSIS — F419 Anxiety disorder, unspecified: Secondary | ICD-10-CM | POA: Diagnosis not present

## 2016-01-29 DIAGNOSIS — E860 Dehydration: Secondary | ICD-10-CM | POA: Insufficient documentation

## 2016-01-29 DIAGNOSIS — R0600 Dyspnea, unspecified: Secondary | ICD-10-CM

## 2016-01-29 DIAGNOSIS — F32A Depression, unspecified: Secondary | ICD-10-CM

## 2016-01-29 DIAGNOSIS — J449 Chronic obstructive pulmonary disease, unspecified: Secondary | ICD-10-CM | POA: Diagnosis not present

## 2016-01-29 DIAGNOSIS — R634 Abnormal weight loss: Secondary | ICD-10-CM | POA: Insufficient documentation

## 2016-01-29 DIAGNOSIS — Z87891 Personal history of nicotine dependence: Secondary | ICD-10-CM

## 2016-01-29 DIAGNOSIS — R05 Cough: Secondary | ICD-10-CM | POA: Diagnosis not present

## 2016-01-29 DIAGNOSIS — E43 Unspecified severe protein-calorie malnutrition: Secondary | ICD-10-CM

## 2016-01-29 LAB — CBC WITH DIFFERENTIAL/PLATELET
Basophils Absolute: 0 10*3/uL (ref 0.0–0.1)
Basophils Relative: 0 %
EOS ABS: 0.1 10*3/uL (ref 0.0–0.7)
EOS PCT: 1 %
HCT: 38.6 % — ABNORMAL LOW (ref 39.0–52.0)
Hemoglobin: 12.8 g/dL — ABNORMAL LOW (ref 13.0–17.0)
LYMPHS ABS: 0.9 10*3/uL (ref 0.7–4.0)
Lymphocytes Relative: 9 %
MCH: 30.3 pg (ref 26.0–34.0)
MCHC: 33.2 g/dL (ref 30.0–36.0)
MCV: 91.5 fL (ref 78.0–100.0)
MONO ABS: 0.6 10*3/uL (ref 0.1–1.0)
MONOS PCT: 6 %
Neutro Abs: 8.3 10*3/uL — ABNORMAL HIGH (ref 1.7–7.7)
Neutrophils Relative %: 84 %
PLATELETS: 310 10*3/uL (ref 150–400)
RBC: 4.22 MIL/uL (ref 4.22–5.81)
RDW: 13.8 % (ref 11.5–15.5)
WBC: 9.9 10*3/uL (ref 4.0–10.5)

## 2016-01-29 LAB — COMPREHENSIVE METABOLIC PANEL
ALT: 16 U/L — ABNORMAL LOW (ref 17–63)
ANION GAP: 10 (ref 5–15)
AST: 24 U/L (ref 15–41)
Albumin: 4 g/dL (ref 3.5–5.0)
Alkaline Phosphatase: 49 U/L (ref 38–126)
BUN: 30 mg/dL — ABNORMAL HIGH (ref 6–20)
CHLORIDE: 106 mmol/L (ref 101–111)
CO2: 26 mmol/L (ref 22–32)
Calcium: 9 mg/dL (ref 8.9–10.3)
Creatinine, Ser: 1.01 mg/dL (ref 0.61–1.24)
GFR calc non Af Amer: >60 mL/min (ref >60)
Glucose, Bld: 105 mg/dL — ABNORMAL HIGH (ref 65–99)
Potassium: 2.9 mmol/L — ABNORMAL LOW (ref 3.5–5.1)
SODIUM: 142 mmol/L (ref 135–145)
Total Bilirubin: 0.8 mg/dL (ref 0.3–1.2)
Total Protein: 6.7 g/dL (ref 6.5–8.1)

## 2016-01-29 LAB — URINALYSIS, ROUTINE W REFLEX MICROSCOPIC
Glucose, UA: NEGATIVE mg/dL
Hgb urine dipstick: NEGATIVE
KETONES UR: NEGATIVE mg/dL
LEUKOCYTES UA: NEGATIVE
NITRITE: NEGATIVE
PROTEIN: 30 mg/dL — AB
Specific Gravity, Urine: 1.024 (ref 1.005–1.030)
pH: 5.5 (ref 5.0–8.0)

## 2016-01-29 LAB — URINE MICROSCOPIC-ADD ON

## 2016-01-29 LAB — I-STAT CG4 LACTIC ACID, ED: Lactic Acid, Venous: 2.56 mmol/L (ref 0.5–1.9)

## 2016-01-29 LAB — TSH: TSH: 0.601 u[IU]/mL (ref 0.350–4.500)

## 2016-01-29 MED ORDER — DIAZEPAM 2 MG PO TABS: ORAL_TABLET | ORAL | 0 refills | Status: DC

## 2016-01-29 MED ORDER — POTASSIUM CHLORIDE CRYS ER 20 MEQ PO TBCR
20.0000 meq | EXTENDED_RELEASE_TABLET | Freq: Once | ORAL | Status: DC
  Filled 2016-01-29: qty 1

## 2016-01-29 MED ORDER — SERTRALINE HCL 50 MG PO TABS: 50.0000 mg | ORAL_TABLET | Freq: Every day | ORAL | 0 refills | Status: DC

## 2016-01-29 MED ORDER — SODIUM CHLORIDE 0.9 % IV BOLUS (SEPSIS)
1000.0000 mL | Freq: Once | INTRAVENOUS | Status: AC
  Administered 2016-01-29: 1000 mL via INTRAVENOUS

## 2016-01-29 MED ORDER — POTASSIUM CHLORIDE CRYS ER 20 MEQ PO TBCR
20.0000 meq | EXTENDED_RELEASE_TABLET | Freq: Once | ORAL | Status: AC
  Administered 2016-01-29: 20 meq via ORAL
  Filled 2016-01-29: qty 1

## 2016-01-29 MED ORDER — POTASSIUM CHLORIDE 10 MEQ/100ML IV SOLN
10.0000 meq | Freq: Once | INTRAVENOUS | Status: AC
  Administered 2016-01-29: 10 meq via INTRAVENOUS
  Filled 2016-01-29: qty 100

## 2016-01-29 NOTE — ED Notes (Signed)
Advised patient that we need urine sample. Son offered to assist his father to use the urinal. Patient agreed he would prefer that.

## 2016-01-29 NOTE — ED Notes (Signed)
Patient's first TSH lab draw was rejected due to hemolyzation.  A straight stick was performed and submitted to the lab for analysis.

## 2016-01-29 NOTE — ED Notes (Signed)
Patient transported to X-ray 

## 2016-01-29 NOTE — ED Notes (Addendum)
Patient refuses to take oral potassium. States will only take it if we let him go home. EDP notified. Patient informed he does have the right to leave against medical advice. Patient currently receiving IV potassium, states he will stay for duration of infusion.

## 2016-01-29 NOTE — ED Notes (Signed)
MD at bedside. 

## 2016-01-29 NOTE — ED Notes (Signed)
Critical Lactic Acid 2.6 Notified to MD

## 2016-01-29 NOTE — Telephone Encounter (Signed)
Please notify social services that when pt is out of hospital I do want social services to go back out to his house.

## 2016-01-29 NOTE — Discharge Instructions (Signed)
You have a low potassium level. This may cause your heart to do strange things. This may leave you personally disabled or even kill you. Please return to the emergency department if you have any worsening symptoms.

## 2016-01-29 NOTE — Telephone Encounter (Signed)
Vikki PortsValerie - Home health nurse called in to make provider aware that pt went to the hospital today so she didn't visit with him. She would like to received the okay to go out to pt's home next week. She says that if no response back she will assume to continue going out.    Please advise further.   CB: [phone]

## 2016-01-29 NOTE — Telephone Encounter (Deleted)
Pt will schedule nurse visit for his flu and hep A vaccine

## 2016-01-29 NOTE — ED Notes (Signed)
Patient returned from x-ray placed back on monitor.  

## 2016-01-29 NOTE — Patient Instructions (Signed)
I did talk with ED MD today and informed him of your recent weight loss and very poor oral intake. They will assess likely give iv fluids and get some lab work.   We will continue to follow you and [facility] and social services. We need to find a way that you can take meds in safe manner and get proper daily nutrition/hydration.   Follow up date 5 days or as needed(Provided ED does not find criteria for admission). Please schedule early am or afternoon. Ask for 30 minute appointment.

## 2016-01-29 NOTE — ED Provider Notes (Addendum)
Received patient from Dr. Rush Landmarkegeler.  Patient sent from clinic for FTT, found to be profoundly clinically dehydrated and hypokalemia.  Only tolerating a boost a day by mouth.  K repleated with some ectopy on the monitor.  Will discuss with hospitalist for tele obs.   The patient is now refusing admission. Discussed with the patient that with his multiple premature ventricular contractions this may be due to his low potassium level. I'm not able to guarantee that this will not cause him some disability or even death. Patient is able to repeat these back to me and feels that he is unable to get it night sleep in the hospital and that is more important to him. We'll have him follow-up with his family doctor in the next couple days. He is welcome to return to the ED at any time.  1. Hypokalemia   2. FTT (failure to thrive) in adult   3. Ventricular ectopy       Melene Planan Jaiana Sheffer, DO 01/29/16 1754    Melene Planan Jalila Goodnough, DO 01/29/16 [id number]

## 2016-01-29 NOTE — Progress Notes (Signed)
Subjective:    Patient ID: Tom RossettiDavid S Duarte, male    DOB: [birth year], 82 y.o.   MRN: [id number]  HPI  Pt in for follow up.   Pt has lost more weight. He is not eating and not drinking much at all. [facility] me [facility] poor intake. One ensure and drinks  coffee a lot during the day per her report.  He is picky eater. Lost 3 pounds since last vist. He had been taking sertraline 2 a day. He was self medicating. It appeared he was not overusing anxiety medication. Pt son is hesitant to send dad to assisted living. Son has some flexibility to visit his son. Pt son was thinking that his dad might move in with him. He wants his dad to be able to make decision where he goes rather than forcing him.  Pt has social service evaluation later today.  [facility] 2 attempts to get him in this week earlier for evaluation due to concern for dehydration. [name] wanted him evaluated this afternoon. Apparenty pt insisted and did not want to come in until now.  Review of Systems  Constitutional: Negative for chills, fatigue and fever.       Weight loss now even further than  Before.  Respiratory: Negative for cough, chest tightness, shortness of breath and wheezing.   Cardiovascular: Negative for chest pain and palpitations.  Gastrointestinal: Negative for abdominal pain, constipation, diarrhea and nausea.  Musculoskeletal: Negative for back pain.  Skin: Negative for rash.  Psychiatric/Behavioral: Positive for dysphoric mood. Negative for behavioral problems, confusion and suicidal ideas. The patient is nervous/anxious.        No suicidal thoughts reported.   Past Medical History:  Diagnosis Date  . Anxiety   . Cataract   . Depression    Years ago not current per patient  . Emphysema of lung (HCC)   . Hypertension      Social History   Social History  . Marital status: Divorced    Spouse name: N/A  . Number of children: N/A  . Years of education: N/A   Occupational History  .  Not on file.   Social History Main Topics  . Smoking status: Current Every Day Smoker  . Smokeless tobacco: Never Used  . Alcohol use 0.0 oz/week  . Drug use: Unknown  . Sexual activity: No   Other Topics Concern  . Not on file   Social History Narrative  . No narrative on file    Past Surgical History:  Procedure Laterality Date  . HERNIA REPAIR     rt side. year 2000.  Marland Kitchen. HIP SURGERY     Joint replacement  . LEG SURGERY     Left    Family History  Problem Relation Age of Onset  . GER disease Mother   . Heart disease Father     No Known Allergies  Current Outpatient Prescriptions on File Prior to Visit  Medication Sig Dispense Refill  . albuterol (PROVENTIL HFA;VENTOLIN HFA) 108 (90 Base) MCG/ACT inhaler Inhale 2 puffs into the lungs every 6 (six) hours as needed for wheezing or shortness of breath. 1 Inhaler 2  . aspirin 325 MG tablet Take 325 mg by mouth daily. 2 daily    . budesonide-formoterol (SYMBICORT) 160-4.5 MCG/ACT inhaler Inhale 2 puffs into the lungs 2 (two) times daily. 1 Inhaler 3  . diazepam (VALIUM) 2 MG tablet 1 tab po bid prn severe anxiety 28 tablet 0  .  lisinopril (PRINIVIL,ZESTRIL) 5 MG tablet Take 1 tablet (5 mg total) by mouth daily. 30 tablet 0  . sertraline (ZOLOFT) 50 MG tablet Take 1 tablet (50 mg total) by mouth daily. 14 tablet 0   No current facility-administered medications on file prior to visit.     BP 105/68   Pulse 91   Temp 98 F (36.7 C) (Oral)   Ht 5\' 9"  (1.753 m)   Wt 101 lb 3.2 oz (45.9 kg)   SpO2 95%   BMI 14.94 kg/m       Objective:   Physical Exam    General Appearance- Very thin now cachectic appearance.  HEENT Eyes- Scleraeral/Conjuntiva-bilat- Not Yellow. Mouth & Throat- Normal.  Chest and Lung Exam Auscultation: Breath sounds:-Normal. Adventitious sounds:- No Adventitious sounds.  Cardiovascular Auscultation:Rythm - Regular. Heart Sounds -Normal heart sounds.  Abdomen Inspection:-Inspection  Normal.  Palpation/Perucssion: Palpation and Percussion of the abdomen reveal- Non Tender, No Rebound tenderness, No rigidity(Guarding) and No Palpable abdominal masses.  Liver:-Normal.  Spleen:- Normal.   Back- on palpation can feels ribs easily.  Skin- feels dry.(tenting)        Assessment & Plan:  I did talk with ED MD today and informed him of your recent weight loss and very poor oral intake. They will assess likely give iv fluids and get some lab work.   We will continue to follow you and communicate with [facility] RN and social services. We need to find a way that you can take meds in safe manner and get proper daily nutrition/hydration.   Follow up date 5 days or as needed(Provided ED does not find criteria for admission). Please schedule early am or afternoon. Ask for 30 minute appointment.

## 2016-01-29 NOTE — ED Triage Notes (Signed)
Pt sent here from PMD office for eval of dehydration

## 2016-01-29 NOTE — ED Provider Notes (Signed)
[facility] Provider Note   CSN: [id number] Arrival date & time: 01/29/16  1359     History   Chief Complaint Chief Complaint  Patient presents with  . Dehydration    HPI Tom Duarte is a 82 y.o. male with a past medical history significant for COPD, hypertension, and depression who presents from his primary care physician for weight loss and concern for dehydration. Patient is committed by son who report that the patient has struggled with depression for many years however, the last several months it has worsened. Son reports that the patient has been losing weight precipitously (8 pounds) over the last several weeks. Patient reports that he has lost all appetite and drinks approximately one ensure a day. PCP called this examiner expressing concern for severe dehydration which could lead to organ dysfunction or electrolyte abnormality.  According to the patient, he has no desire to hurt himself or others, he denies any chest pain, shortness of breath, nausea, vomiting, constipation, diarrhea, or dysuria. Patient reports he is able to get around without difficulty. He denies any falls. Patient does report a chronic cough which son thinks is unchanged from prior.   The history is provided by the patient, a caregiver, a relative and medical records (patient's PCP by phone and Son). No language interpreter was used.  Illness  This is a new problem. The current episode started more than 1 week ago. The problem occurs constantly. The problem has been gradually worsening. Pertinent negatives include no chest pain, no abdominal pain, no headaches and no shortness of breath. Associated symptoms comments: Weight loss, appetite decreased. Nothing aggravates the symptoms. Nothing relieves the symptoms. He has tried nothing for the symptoms. The treatment provided no relief.    Past Medical History:  Diagnosis Date  . Anxiety   . Cataract   . Depression    Years ago not current per  patient  . Emphysema of lung (HCC)   . Hypertension     Patient Active Problem List   Diagnosis Date Noted  . HTN (hypertension) 06/05/2014  . Other emphysema (HCC) 06/05/2014    Past Surgical History:  Procedure Laterality Date  . HERNIA REPAIR     rt side. year 2000.  Marland Kitchen HIP SURGERY     Joint replacement  . LEG SURGERY     Left       Home Medications    Prior to Admission medications   Medication Sig Start Date End Date Taking? Authorizing Provider  albuterol (PROVENTIL HFA;VENTOLIN HFA) 108 (90 Base) MCG/ACT inhaler Inhale 2 puffs into the lungs every 6 (six) hours as needed for wheezing or shortness of breath. 01/15/16   Ramon Dredge Saguier, PA-C  aspirin 325 MG tablet Take 325 mg by mouth daily. 2 daily    Historical Provider, MD  budesonide-formoterol (SYMBICORT) 160-4.5 MCG/ACT inhaler Inhale 2 puffs into the lungs 2 (two) times daily. 01/15/16   Ramon Dredge Saguier, PA-C  diazepam (VALIUM) 2 MG tablet 1 tab po bid prn severe anxiety 01/29/16   Esperanza Richters, PA-C  lisinopril (PRINIVIL,ZESTRIL) 5 MG tablet Take 1 tablet (5 mg total) by mouth daily. 12/31/15   Ramon Dredge Saguier, PA-C  sertraline (ZOLOFT) 50 MG tablet Take 1 tablet (50 mg total) by mouth daily. 01/29/16   Esperanza Richters, PA-C    Family History Family History  Problem Relation Age of Onset  . GER disease Mother   . Heart disease Father     Social History Social History  Substance Use  Topics  . Smoking status: Current Every Day Smoker  . Smokeless tobacco: Never Used  . Alcohol use 0.0 oz/week     Allergies   Review of patient's allergies indicates no known allergies.   Review of Systems Review of Systems  Constitutional: Positive for appetite change, fatigue and unexpected weight change. Negative for chills, diaphoresis and fever.  HENT: Negative for congestion and rhinorrhea.   Eyes: Negative for visual disturbance.  Respiratory: Negative for cough, choking, chest tightness, shortness of breath, wheezing  and stridor.   Cardiovascular: Negative for chest pain, palpitations and leg swelling.  Gastrointestinal: Negative for abdominal pain.  Genitourinary: Negative for dysuria and flank pain.  Musculoskeletal: Negative for back pain, neck pain and neck stiffness.  Skin: Negative for wound.  Neurological: Negative for dizziness, seizures, syncope, weakness, light-headedness, numbness and headaches.  Psychiatric/Behavioral: Negative for agitation and confusion.  All other systems reviewed and are negative.    Physical Exam Updated Vital Signs BP 115/72   Pulse 78   Temp 97.8 F (36.6 C) (Oral)   Resp 16   Ht 5\' 9"  (1.753 m)   Wt 101 lb (45.8 kg)   SpO2 97%   BMI 14.92 kg/m   Physical Exam  Constitutional: He appears cachectic.  Non-toxic appearance. He does not have a sickly appearance. He does not appear ill. No distress.  HENT:  Head: Normocephalic and atraumatic.  Mouth/Throat: Oropharynx is clear and moist. No oropharyngeal exudate.  Eyes: Conjunctivae and EOM are normal. Pupils are equal, round, and reactive to light.  Neck: Normal range of motion. Neck supple.  Cardiovascular: Normal rate and regular rhythm.   No murmur heard. Pulmonary/Chest: Effort normal and breath sounds normal. No stridor. No respiratory distress. He exhibits no tenderness.  Abdominal: Soft. He exhibits no distension. There is no tenderness.  Musculoskeletal: He exhibits no edema.  Lymphadenopathy:    He has no cervical adenopathy.  Neurological: He is alert. He exhibits normal muscle tone.  Skin: Skin is warm and dry. He is not diaphoretic.  Patient has generalized cachexia.   Psychiatric: His speech is normal and behavior is normal. His affect is not angry. He is not actively hallucinating. He exhibits a depressed mood. He expresses no homicidal and no suicidal ideation. He expresses no suicidal plans and no homicidal plans.  PT confirms depression with no suicidal or homicidal ideation.  Nursing  note and vitals reviewed.    ED Treatments / Results  Labs (all labs ordered are listed, but only abnormal results are displayed) Labs Reviewed  CBC WITH DIFFERENTIAL/PLATELET - Abnormal; Notable for the following:       Result Value   Hemoglobin 12.8 (*)    HCT 38.6 (*)    Neutro Abs 8.3 (*)    All other components within normal limits  COMPREHENSIVE METABOLIC PANEL - Abnormal; Notable for the following:    Potassium 2.9 (*)    Glucose, Bld 105 (*)    BUN 30 (*)    ALT 16 (*)    All other components within normal limits  I-STAT CG4 LACTIC ACID, ED - Abnormal; Notable for the following:    Lactic Acid, Venous 2.56 (*)    All other components within normal limits  TSH  URINALYSIS, ROUTINE W REFLEX MICROSCOPIC (NOT AT [facility])    EKG  EKG Interpretation None       Radiology Dg Chest 2 View  Result Date: 01/29/2016 CLINICAL DATA:  Cough. EXAM: CHEST  2 VIEW COMPARISON:  06/05/2014  FINDINGS: There is hyperinflation of the lungs compatible with COPD. Scarring at the left base. Right lung is clear. No effusions. Heart is normal size. No acute bony abnormality. IMPRESSION: COPD.  Scarring at the left base.  No active disease. Electronically Signed   By: Charlett Nose M.D.   On: 01/29/2016 15:59    Procedures Procedures (including critical care time)  Medications Ordered in ED Medications  potassium chloride SA (K-DUR,KLOR-CON) CR tablet 20 mEq (20 mEq Oral Refused 01/29/16 1827)  sodium chloride 0.9 % bolus 1,000 mL (0 mLs Intravenous Stopped 01/29/16 1802)  sodium chloride 0.9 % bolus 1,000 mL (0 mLs Intravenous Stopped 01/29/16 1654)  potassium chloride SA (K-DUR,KLOR-CON) CR tablet 20 mEq (20 mEq Oral Given 01/29/16 1523)  potassium chloride 10 mEq in 100 mL IVPB (0 mEq Intravenous Stopped 01/29/16 1926)  potassium chloride SA (K-DUR,KLOR-CON) CR tablet 20 mEq (20 mEq Oral Given 01/29/16 1845)     Initial Impression / Assessment and Plan / ED Course  I have reviewed the  triage vital signs and the nursing notes.  Pertinent labs & imaging results that were available during my care of the patient were reviewed by me and considered in my medical decision making (see chart for details).  Clinical Course    Tom Duarte is a 82 y.o. male with a past medical history significant for COPD, hypertension, and depression who presents from his primary care physician for weight loss and concern for dehydration. On arrival, patient appears cachectic but denies any complaints aside from depression. Patient does report loss of appetite but denies any other complaints. Son, who is at bedside, is concerned about all of his recent weight loss. PCP also expressed concern for possible electron abnormality versus organ dysfunction versus infection causing change in patient's appetite and appearance.  Patient had initial screening laboratory testing performed which revealed a lactic acid elevated at 2.56. CMP also revealed low potassium of 2.9. White blood cell count not elevated and hemoglobin slightly anemic at 12.8.  The lactic acidosis, tests for occult infection were ordered including urinalysis and chest x-ray. Oral potassium ordered to replete hypokalemia.  Chest x-ray shows evidence of COPD with lung scarring but no acute infection or abnormality.  Given patient's precipitous weight loss, dehydration as seen by elevated lactic acid, and abnormal electrolyte values, patient will likely require admission for further management.   PT's care transferred to Dr. Adela Lank to help coordinate likely admission.    Final Clinical Impressions(s) / ED Diagnoses   Final diagnoses:  Hypokalemia  FTT (failure to thrive) in adult  Ventricular ectopy    New Prescriptions New Prescriptions   No medications on file     Heide Scales, MD 01/29/16 1934

## 2016-01-29 NOTE — ED Notes (Signed)
Advised patient we needed a urine sample; but patient was unable to provide.

## 2016-01-29 NOTE — Progress Notes (Signed)
82 year old [facility] for failure to [facility], weight loss 8 pounds over last couple weeks, vision reports poor appetite, Significant for mild hypokalemia, ectopy, mild dehydration, as well with elevated lactic acid but negative urinalysis, negative chest x-ray with no evidence of infection, and accepted observation, telemetry(given ectopy with hypokalemia), will be admitted for IV hydration, nutrition consult and PT/OT consult and possible need for placement. Huey Bienenstockawood Elgergawy MD

## 2016-01-29 NOTE — ED Notes (Signed)
Patient states not eating and drinking well. Does not have a big appetite.

## 2016-02-01 ENCOUNTER — Telehealth: Payer: Self-pay

## 2016-02-01 DIAGNOSIS — F329 Major depressive disorder, single episode, unspecified: Secondary | ICD-10-CM | POA: Diagnosis not present

## 2016-02-01 DIAGNOSIS — F419 Anxiety disorder, unspecified: Secondary | ICD-10-CM | POA: Diagnosis not present
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
## 2016-02-01 NOTE — Telephone Encounter (Signed)
Home Health Nurse in to talk to Es regarding Patiens leaving ED AMA and refusal of Home Health Services over the weekend.

## 2016-02-02 DIAGNOSIS — F329 Major depressive disorder, single episode, unspecified: Secondary | ICD-10-CM | POA: Diagnosis not present

## 2016-02-02 DIAGNOSIS — F419 Anxiety disorder, unspecified: Secondary | ICD-10-CM | POA: Diagnosis not present
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
## 2016-02-02 NOTE — Telephone Encounter (Signed)
ok 

## 2016-02-02 NOTE — Telephone Encounter (Signed)
I sent Dr. Laury AxonLowne a note regarding difficlut situation. Would you call [name] daughter to get Mr. Dinning to  follow up with me. I need at least 30 minutes. Early afternon appointment at 1 pm. But let them know still best course of action will likely be admission to hospital [facility].

## 2016-02-02 NOTE — Telephone Encounter (Signed)
Patient came in office for OV on the day Home Health called.

## 2016-02-02 NOTE — Telephone Encounter (Signed)
Dr. Laury AxonLowne,  This is note on pt who is very thin. Cachectic pt continuing  loosing weight. I have arranged home health and social services. He is taking meds inappropriately(sertraline double dosing). Lives by himself. Not eating much or drinking. HH situation not working out and pt family refuses to consider assisted living. I advised to go to ED for fluids and maybe hospitalization this past Friday. He had low k and pvc. They were going to admit him and then he refused. Son agreed with dad.   Most recently [facility] after he refused to go to ED.   I have talked with daughter extreme importance of getting admitted nourished, rehydrate and K normalized. But she still refuses. Any suggestions??
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
## 2016-02-03 ENCOUNTER — Telehealth: Payer: Self-pay

## 2016-02-03 ENCOUNTER — Telehealth: Payer: Self-pay | Admitting: Medical

## 2016-02-03 ENCOUNTER — Encounter: Payer: Self-pay | Admitting: Medical

## 2016-02-03 ENCOUNTER — Telehealth: Payer: Self-pay | Admitting: Internal Medicine

## 2016-02-03 ENCOUNTER — Other Ambulatory Visit: Payer: Self-pay

## 2016-02-03 ENCOUNTER — Ambulatory Visit (INDEPENDENT_AMBULATORY_CARE_PROVIDER_SITE_OTHER): Payer: Medicare Other | Admitting: Medical

## 2016-02-03 VITALS — BP 120/74 | HR 71 | Temp 97.7°F | Ht 69.0 in | Wt 104.4 lb

## 2016-02-03 DIAGNOSIS — F32A Depression, unspecified: Secondary | ICD-10-CM

## 2016-02-03 DIAGNOSIS — R627 Adult failure to thrive: Secondary | ICD-10-CM | POA: Diagnosis not present

## 2016-02-03 DIAGNOSIS — E876 Hypokalemia: Secondary | ICD-10-CM | POA: Diagnosis not present

## 2016-02-03 DIAGNOSIS — R9431 Abnormal electrocardiogram [ECG] [EKG]: Secondary | ICD-10-CM

## 2016-02-03 DIAGNOSIS — E46 Unspecified protein-calorie malnutrition: Secondary | ICD-10-CM

## 2016-02-03 DIAGNOSIS — E860 Dehydration: Secondary | ICD-10-CM | POA: Diagnosis not present

## 2016-02-03 DIAGNOSIS — F329 Major depressive disorder, single episode, unspecified: Secondary | ICD-10-CM

## 2016-02-03 LAB — COMPREHENSIVE METABOLIC PANEL
ALBUMIN: 3.6 g/dL (ref 3.5–5.2)
ALK PHOS: 44 U/L (ref 39–117)
ALT: 16 U/L (ref 0–53)
AST: 20 U/L (ref 0–37)
BUN: 18 mg/dL (ref 6–23)
CALCIUM: 8.5 mg/dL (ref 8.4–10.5)
CHLORIDE: 107 meq/L (ref 96–112)
CO2: 26 mEq/L (ref 19–32)
Creatinine, Ser: 0.84 mg/dL (ref 0.40–1.50)
GFR: 92.33 mL/min (ref >60.00)
Glucose, Bld: 83 mg/dL (ref 70–99)
Potassium: 3.6 mEq/L (ref 3.5–5.1)
Sodium: 142 mEq/L (ref 135–145)
TOTAL PROTEIN: 6 g/dL (ref 6.0–8.3)
Total Bilirubin: 0.5 mg/dL (ref 0.2–1.2)

## 2016-02-03 LAB — TROPONIN I: TNIDX: 0.04 ug/l (ref 0.00–0.06)

## 2016-02-03 MED ORDER — DIAZEPAM 2 MG PO TABS: ORAL_TABLET | ORAL | 0 refills | Status: DC

## 2016-02-03 MED ORDER — SERTRALINE HCL 50 MG PO TABS: 50.0000 mg | ORAL_TABLET | Freq: Every day | ORAL | 0 refills | Status: DC

## 2016-02-03 NOTE — Telephone Encounter (Signed)
I talked with Vikki PortsValerie Child psychotherapistsocial worker today. Will advise continuing home health. I will contact hospice at pt request although I am doubtful he meets criteria. But son wants me to investigate.   Would you get me number to hospice so I can call them.  Thanks.

## 2016-02-03 NOTE — Telephone Encounter (Signed)
Called and spoke with Ed Saguier, PA-C. He asked me to review an EKG on Tom Duarte. There is ST elevation in V2-V4 up to 4 mm at the j point, which is concave. He was concerned about hyperkalemia, however, the EKG looks ischemic to me - suggestive of active or recent infarct pattern. Q waves are noted in v1-v3. I recommend the patient be sent to the ER for ischemic evaluation. Cardiology will be available as needed.  Chrystie NoseKenneth C. Latravion Graves, MD, [facility] Attending Cardiologist [facility]

## 2016-02-03 NOTE — Telephone Encounter (Signed)
Rx faxed to pharmacy daugter states [facility].

## 2016-02-03 NOTE — Telephone Encounter (Signed)
Dot LanesKrista called to talk to you about this patient. [facility] is [facility]. Wants to let you know  K+ worked good for the patient in the hospital and she thought that would be helpful. Patient's son is managing medication for the patient    She needs order for 1 visit for next week can be reached at [phone]

## 2016-02-03 NOTE — Progress Notes (Addendum)
Subjective:    Patient ID: Tom Duarte, male    DOB: 11/09/1928, 82 y.o.   MRN: [id number]  HPI   Pt in for follow up.  He has been evaluated in ED. Low k level. Pt had some pvc that was seen on ekg per ED note. Commented on in ED report. But I don't see the actual ekg. LPN called down stairs and they said must have been seen on monitor and no ekg.    Pt denies any sob, arm pain, chest pressure or back pain. No abdomen pain.  Pt now eating/drinking 3 ensure a day. Drinking cokes and some coffee. Sister had some egg salad. He also ate some egg plant paremesian the other day. Son and daughter are splitting time over/daily check ups. Pt son is only giving him 3 days worth of sertraline at a time. He is not over using. Son keeps his tabs.  Pt is using symbicort now. Relying more on albuterol.  He is taking care of himself with better personal hygeine.  Pt is in better mood. More engaging in conversation.  Pt refused hospitalization when he was in ED last time. Pt was given k in ED. They did not give him k tabs for home.   Today when I explained best for him to be seen down stairs in ED. I explained repeat ekg/monitor and get enzymes as well as repeat cmp. Son adamantly refused to go. He stated this may belligerant and I apologize but if you do anything it will need to be outpatient.  Son interested in hospice or palliative early in on interview.     Review of Systems  Constitutional: Negative for chills, fatigue and fever.  HENT: Negative for congestion and drooling.   Respiratory: Negative for cough, chest tightness, shortness of breath and wheezing.   Cardiovascular: Negative for chest pain and palpitations.  Gastrointestinal: Negative for abdominal pain, blood in stool and constipation.  Musculoskeletal: Negative for back pain.  Skin: Negative for rash.  Neurological: Negative for dizziness and headaches.  Hematological: Negative for adenopathy. Does not bruise/bleed  easily.  Psychiatric/Behavioral: Negative for agitation and confusion.    Past Medical History:  Diagnosis Date  . Anxiety   . Cataract   . Depression    Years ago not current per patient  . Emphysema of lung (HCC)   . Hypertension      Social History   Social History  . Marital status: Divorced    Spouse name: N/A  . Number of children: N/A  . Years of education: N/A   Occupational History  . Not on file.   Social History Main Topics  . Smoking status: Current Every Day Smoker  . Smokeless tobacco: Never Used  . Alcohol use 0.0 oz/week  . Drug use: Unknown  . Sexual activity: No   Other Topics Concern  . Not on file   Social History Narrative  . No narrative on file    Past Surgical History:  Procedure Laterality Date  . HERNIA REPAIR     rt side. year 2000.  Marland Kitchen HIP SURGERY     Joint replacement  . LEG SURGERY     Left    Family History  Problem Relation Age of Onset  . GER disease Mother   . Heart disease Father     No Known Allergies  Current Outpatient Prescriptions on File Prior to Visit  Medication Sig Dispense Refill  . albuterol (PROVENTIL HFA;VENTOLIN HFA) 108 (90 Base)  MCG/ACT inhaler Inhale 2 puffs into the lungs every 6 (six) hours as needed for wheezing or shortness of breath. 1 Inhaler 2  . aspirin 325 MG tablet Take 325 mg by mouth daily. 2 daily    . budesonide-formoterol (SYMBICORT) 160-4.5 MCG/ACT inhaler Inhale 2 puffs into the lungs 2 (two) times daily. 1 Inhaler 3  . diazepam (VALIUM) 2 MG tablet 1 tab po bid prn severe anxiety 10 tablet 0  . lisinopril (PRINIVIL,ZESTRIL) 5 MG tablet Take 1 tablet (5 mg total) by mouth daily. 30 tablet 0  . sertraline (ZOLOFT) 50 MG tablet Take 1 tablet (50 mg total) by mouth daily. 10 tablet 0   No current facility-administered medications on file prior to visit.     BP 120/74   Pulse 71   Temp 97.7 F (36.5 C)   Ht 5\' 9"  (1.753 m)   Wt 104 lb 6.4 oz (47.4 kg)   SpO2 96%   BMI 15.42  kg/m       Objective:   Physical Exam  General Appearance- Very thin but not as cachectic appearing as before. Appears more energetic.  HEENT Eyes- Scleraeral/Conjuntiva-bilat- Not Yellow. Mouth & Throat- Normal.  Chest and Lung Exam Auscultation: Breath sounds:-Normal. Adventitious sounds:- No Adventitious sounds.  Cardiovascular Auscultation:Rythm - Regular. Heart Sounds -Normal heart sounds.   Back- on palpation can feels ribs but not as prominent as before.  Skin- feels dry.(some tenting but not as bad as before)  .      Assessment & Plan:  I did talk with ED physician today.   I informed him of better fluid and meal intake recently as well as 3 lb weight gain.  But on review of ekg today their is  no ekg to compare(some worrisome maybe new findings) I think best to get evaluated in ED. Maybe benefit from heart protein study. Repeat ekg/monitor.  Note reviewed and thought maybe some potential ischemia in v leads(I thought maybe peaked t wave before talked with him). Cardiogist I talked with confirmed the potential ischemia.  Also we need to know the status of your k level.  I will try to see if qualify for hospice/palliative care. But if recent trend continues assited living or other options may not be necessary.  Follow up Friday or this coming Monday.  [phone].  As stated in HPI son refused. He was explained benefit of evalution and risk. He still was adamant will not go to ED. As he was adamant last ED visit would not allow dad to be admitted to hospital.  Talked with Dr. Drue NovelPaz about pt and his ekg today. We decided to try to get cardiologist to review ekg.  [facility] message on both [facility] and [facility] about cardiologist advisement ED evaluation is best . Go now. Daryl Easternried Scott first but since no answer left message with [name]. Explained cardiologsit reviewed ekg and clinical scenarios and he agrees.

## 2016-02-03 NOTE — Telephone Encounter (Signed)
Valium printed. If you get me to sign. Will you then fax to his pharmacy.

## 2016-02-03 NOTE — Telephone Encounter (Signed)
Called patients daughter to see which Pharmacy they woulfd like patients Rx faxed to.

## 2016-02-03 NOTE — Telephone Encounter (Signed)
Please call him asap,concerning patient's abnormal EKG.

## 2016-02-03 NOTE — Telephone Encounter (Signed)
Per E Saguier we need copy of EKG done in ED on Past Friday. Looked in chart did not see a copy. Called ED who states the may have had patient on monitor but did not record copy for system. E. Saguier advised.

## 2016-02-03 NOTE — Telephone Encounter (Signed)
Vikki PortsValerie - Child psychotherapistocial Worker for [facility] called in to speak with provider .    PH: [phone]

## 2016-02-03 NOTE — Patient Instructions (Addendum)
I did talk with ED physician today.   I informed him of better fluid and meal intake recently as well as 3 lb weight gain.  But on review of ekg today there is no ekg to compare(some worrisome maybe new findings) I think best to get evaluated in ED. Maybe benefit from heart protein study. Repeat ekg/monitor.  Also we need to know the status of your k level.  I will try to see if qualify for hospice/palliative care. But if recent trend continues assited living or other options may not be necessary.  Follow up Friday or this coming Monday. Did refill is depression and anxiety meds not that it appears med being dispensed safe manner  [phone].  As stated in HPI son refused. He was explained benefit of evalution and risk. He still was adamant will not go to ED. As he was adamant last ED visit would not allow dad to be admitted to hospital.

## 2016-02-04 NOTE — Telephone Encounter (Signed)
Received fax for order below.  Form numbered and placed in provider's red folder for review and signature.

## 2016-02-08 ENCOUNTER — Encounter: Payer: Self-pay | Admitting: Medical

## 2016-02-08 ENCOUNTER — Ambulatory Visit (INDEPENDENT_AMBULATORY_CARE_PROVIDER_SITE_OTHER): Payer: Medicare Other | Admitting: Medical

## 2016-02-08 VITALS — BP 95/60 | HR 81 | Temp 98.0°F | Ht 69.0 in | Wt 104.6 lb

## 2016-02-08 DIAGNOSIS — F329 Major depressive disorder, single episode, unspecified: Secondary | ICD-10-CM | POA: Diagnosis not present

## 2016-02-08 DIAGNOSIS — F419 Anxiety disorder, unspecified: Secondary | ICD-10-CM | POA: Diagnosis not present

## 2016-02-08 DIAGNOSIS — Z720 Tobacco use: Secondary | ICD-10-CM | POA: Diagnosis not present

## 2016-02-08 DIAGNOSIS — Z87891 Personal history of nicotine dependence: Secondary | ICD-10-CM

## 2016-02-08 DIAGNOSIS — Z8639 Personal history of other endocrine, nutritional and metabolic disease: Secondary | ICD-10-CM

## 2016-02-08 DIAGNOSIS — I10 Essential (primary) hypertension: Secondary | ICD-10-CM

## 2016-02-08 DIAGNOSIS — E46 Unspecified protein-calorie malnutrition: Secondary | ICD-10-CM | POA: Diagnosis not present

## 2016-02-08 DIAGNOSIS — Z8709 Personal history of other diseases of the respiratory system: Secondary | ICD-10-CM

## 2016-02-08 DIAGNOSIS — R9431 Abnormal electrocardiogram [ECG] [EKG]: Secondary | ICD-10-CM

## 2016-02-08 LAB — COMPREHENSIVE METABOLIC PANEL
ALBUMIN: 3.5 g/dL (ref 3.5–5.2)
ALT: 15 U/L (ref 0–53)
AST: 18 U/L (ref 0–37)
Alkaline Phosphatase: 46 U/L (ref 39–117)
BUN: 19 mg/dL (ref 6–23)
CHLORIDE: 104 meq/L (ref 96–112)
CO2: 31 mEq/L (ref 19–32)
CREATININE: 0.89 mg/dL (ref 0.40–1.50)
Calcium: 8.8 mg/dL (ref 8.4–10.5)
GFR: 86.36 mL/min (ref >60.00)
Glucose, Bld: 88 mg/dL (ref 70–99)
Potassium: 4.1 mEq/L (ref 3.5–5.1)
SODIUM: 140 meq/L (ref 135–145)
TOTAL PROTEIN: 6.1 g/dL (ref 6.0–8.3)
Total Bilirubin: 0.4 mg/dL (ref 0.2–1.2)

## 2016-02-08 LAB — TROPONIN I: TNIDX: 0.01 ug/L (ref 0.00–0.06)

## 2016-02-08 MED ORDER — DIAZEPAM 2 MG PO TABS
ORAL_TABLET | ORAL | 0 refills | Status: DC
Start: 2016-02-08 — End: 2016-03-04

## 2016-02-08 NOTE — Telephone Encounter (Signed)
Tom BattenKim, Talked with Dr. Laury AxonLowne about the pt. Which hospice/pallitative care group do you find most helpful. What is their number. Thanks. Tom Duarte

## 2016-02-08 NOTE — Addendum Note (Signed)
Addended by: Gwenevere AbbotSAGUIER, Quintara Bost M on: 02/08/2016 08:35 AM   Modules accepted: Orders

## 2016-02-08 NOTE — Telephone Encounter (Signed)
I did talk with palliative care today and he is assigned NP per staff. They will be getting a call or email today or tomorrow.

## 2016-02-08 NOTE — Telephone Encounter (Signed)
Forms [facility].     KP

## 2016-02-08 NOTE — Progress Notes (Signed)
Pre visit review using our clinic tool,if applicable. No additional management support is needed unless otherwise documented below in the visit note.  

## 2016-02-08 NOTE — Patient Instructions (Addendum)
For your prior ekg that showed possible ischemia we got repeat today to compare.  We repeated your troponin(heart protein study as well).  For you low potassium history will repeat k level again today with cmp.  Keep appointment with pulmonologist tomorrow.  Continue inhalers but specialist may modify treatment and may rx oxygen.  You bp is low stay off of your bp medication.  For weight loss, malnutrition , history of low k and overall health we made palliative care referral. Please call them by tomorrow if no one has called you.  For anxiety and depression. Continue zolft and wrote short course of valium until palliative care can assess and determine if other med options may be more desirable.  Follow up with me in 2 weeks or as needed

## 2016-02-08 NOTE — Progress Notes (Signed)
Subjective:    Patient ID: Tom RossettiDavid S Duarte, male    DOB: 01/25/1929, 82 y.o.   MRN: [id number]  HPI  Pt in for follow up. He gained 4 oz since last visit. Appears to be wearing the same clothes.   Pt is drinking only 2 ensure a day. I encouraged him to have more 3-4 a day. Tom SealGail and Tom Duarte often prepare meals and if they hang out with him. He will eat more.   Pt last k level was in the lower level. Pt family was feeding him sweet potato and bananas.  Pt not having any chest pain. No jaw pain.   Pt has copd. He should be on symbicort 2 inhalations twice a day. He is using albuterol as well but more frequently. Pt has pulmonologist appoitnment tomorrow. I had scheduled this for him in the past and he did not go to appointment.  I have contacted [facility] [place]. Their number is [phone].       Review of Systems  Constitutional: Negative for chills and fatigue.  HENT: Negative for congestion and ear discharge.   Respiratory: Positive for shortness of breath and wheezing. Negative for cough and chest tightness.   Cardiovascular: Negative for chest pain and palpitations.  Gastrointestinal: Negative for abdominal pain.  Neurological: Negative for dizziness.  Hematological: Negative for adenopathy. Does not bruise/bleed easily.  Psychiatric/Behavioral: Negative for behavioral problems and confusion.   Past Medical History:  Diagnosis Date  . Anxiety   . Cataract   . Depression    Years ago not current per patient  . Emphysema of lung (HCC)   . Hypertension      Social History   Social History  . Marital status: Divorced    Spouse name: N/A  . Number of children: N/A  . Years of education: N/A   Occupational History  . Not on file.   Social History Main Topics  . Smoking status: Current Every Day Smoker  . Smokeless tobacco: Never Used  . Alcohol use 0.0 oz/week  . Drug use: Unknown  . Sexual activity: No   Other Topics Concern  . Not on file    Social History Narrative  . No narrative on file    Past Surgical History:  Procedure Laterality Date  . HERNIA REPAIR     rt side. year 2000.  Marland Kitchen. HIP SURGERY     Joint replacement  . LEG SURGERY     Left    Family History  Problem Relation Age of Onset  . GER disease Mother   . Heart disease Father     No Known Allergies  Current Outpatient Prescriptions on File Prior to Visit  Medication Sig Dispense Refill  . albuterol (PROVENTIL HFA;VENTOLIN HFA) 108 (90 Base) MCG/ACT inhaler Inhale 2 puffs into the lungs every 6 (six) hours as needed for wheezing or shortness of breath. 1 Inhaler 2  . aspirin 325 MG tablet Take 325 mg by mouth daily. 2 daily    . budesonide-formoterol (SYMBICORT) 160-4.5 MCG/ACT inhaler Inhale 2 puffs into the lungs 2 (two) times daily. 1 Inhaler 3  . diazepam (VALIUM) 2 MG tablet 1 tab po bid prn severe anxiety 10 tablet 0  . lisinopril (PRINIVIL,ZESTRIL) 5 MG tablet Take 1 tablet (5 mg total) by mouth daily. 30 tablet 0  . sertraline (ZOLOFT) 50 MG tablet Take 1 tablet (50 mg total) by mouth daily. 30 tablet 0   No current facility-administered medications on file prior to  visit.     BP 95/60   Pulse 81   Temp 98 F (36.7 C)   Ht 5\' 9"  (1.753 m)   Wt 104 lb 9.6 oz (47.4 kg)   SpO2 96%   BMI 15.45 kg/m       Objective:   Physical Exam  General Mental Status- Alert. General Appearance- Not in acute distress. But still has very frail thin appearance.  Skin General: Color- Normal Color. Moisture- Normal Moisture.  Neck Carotid Arteries- Normal color. Moisture- Normal Moisture. No carotid bruits. No JVD.  Chest and Lung Exam Auscultation: Breath Sounds:-Normal.  Cardiovascular Auscultation:Rythm- Regular. Murmurs & Other Heart Sounds:Auscultation of the heart reveals- No Murmurs.  Abdomen Inspection:-Inspeection Normal. Palpation/Percussion:Note:No mass. Palpation and Percussion of the abdomen reveal- Non Tender, Non Distended  + BS, no rebound or guarding.    Neurologic Cranial Nerve exam:- CN III-XII intact(No nystagmus), symmetric smile. Strength:- 5/5 equal and symmetric strength both upper and lower extremities.      Assessment & Plan:  For your prior ekg that showed possible ischemia we got repeat today to compare.(ekg v3 and v4 looked similar). V4 qrs now more upright and more normal t wave appearance. V1, v2 and v3 looked almost exact same.  We repeated your troponin(heart protein study as well).  For you low potassium history will repeat k level again today with cmp.  Keep appointment with pulmonologist tomorrow.  Continue inhalers but specialist may modify treatment and may rx oxygen.  You bp is low stay off of your bp medication.  For weight loss, malnutrition , history of low k and overall health we made palliative care referral. Please call them by tomorrow if no one has called you.  For anxiety and depression. Continue zolft and wrote short course of valium until palliative care can assess and determine if other med options may be more desirable.  Follow up with me in 2 weeks or as needed  Tom Duarte, Tom Duarte, [place]

## 2016-02-09 ENCOUNTER — Institutional Professional Consult (permissible substitution): Payer: PRIVATE HEALTH INSURANCE | Admitting: Internal Medicine

## 2016-02-10 ENCOUNTER — Telehealth: Payer: Self-pay

## 2016-02-10 ENCOUNTER — Institutional Professional Consult (permissible substitution): Payer: PRIVATE HEALTH INSURANCE | Admitting: Pulmonary Disease

## 2016-02-10 NOTE — Telephone Encounter (Signed)
Pt missed his pulmonology appointment. Will they need to call and reschedule. This would be the second time that he did not go. Will you get family and ask them to call and reschedule or will you do that for them. Pt was anxious so he did not go.

## 2016-02-10 NOTE — Telephone Encounter (Signed)
Received call from [place] with [facility]. States patient/fanmily refusing nurse visits. States patient cancelled Pulmonology appointment because he is becoming too anxious with all hs appoinmments.

## 2016-02-10 NOTE — Telephone Encounter (Signed)
Would you call [name] the daughter and confirm that NP from palliative care has contacted them. Has she called them as well. Are they scheduled to come out.

## 2016-02-11 ENCOUNTER — Telehealth: Payer: Self-pay | Admitting: Medical

## 2016-02-11 DIAGNOSIS — R634 Abnormal weight loss: Secondary | ICD-10-CM | POA: Diagnosis not present

## 2016-02-11 MED ORDER — MIRTAZAPINE 7.5 MG PO TABS: 7.5000 mg | ORAL_TABLET | Freq: Every day | ORAL | 2 refills | Status: DC

## 2016-02-11 NOTE — Telephone Encounter (Signed)
I did rx remeron at recommendation of palliative care NP. I called and left Synetta FailAnita NP  message thanking her for going out. Asked her to call me on my cell to give further update on how pt is and direction of care from here on.

## 2016-02-11 NOTE — Telephone Encounter (Signed)
Called patients daughter to see if patient has been [facility]   [facility]. Left message for her to call back.

## 2016-02-11 NOTE — Telephone Encounter (Signed)
Caller name: Synetta Failnita  Relation to pt: Freight forwarderurse Practioner from [facility]  Call back number: [phone]  [id number] [place] - [address] [phone] ([phone] (Fax)     Reason for call:  NP states patient is experiencing depression, weight loss and sleepless night requesting a prescription for Remeron 7.5MG . NP states it will stimulate appetite, help patient sleep at night and will help he's depression. Please advise

## 2016-02-11 NOTE — Telephone Encounter (Signed)
Patient is scheduled for today for @ 11:30, per Maryruth HancockAnn Marie w/ Hospice, spoke with daughter [name], she states her brother will call and reschedule appointment with Pulmonary.

## 2016-02-12 NOTE — Telephone Encounter (Signed)
Discussed and updated Tom CraneJean Nilsson daughter of Mr. Samul DadaDahlin on pt care. Encouraged her to reschedule the pulmonologist appointment which they had missed twice. Explained importance.

## 2016-02-12 NOTE — Telephone Encounter (Signed)
Called daughterleft message regarding if patient has been seen by Pallitive care.

## 2016-02-12 NOTE — Telephone Encounter (Signed)
Caller: Carney BernJean Patient Relation: Daughter Contact Phone: [phone]  Daughter returned your call regarding patient.

## 2016-02-16 DIAGNOSIS — F419 Anxiety disorder, unspecified: Secondary | ICD-10-CM | POA: Diagnosis not present

## 2016-02-16 DIAGNOSIS — F329 Major depressive disorder, single episode, unspecified: Secondary | ICD-10-CM | POA: Diagnosis not present

## 2016-02-25 NOTE — Telephone Encounter (Signed)
Called patient s son to se if patient ever got Rx for Remeron.

## 2016-02-29 ENCOUNTER — Telehealth: Payer: Self-pay | Admitting: Medical

## 2016-02-29 DIAGNOSIS — R634 Abnormal weight loss: Secondary | ICD-10-CM | POA: Diagnosis not present

## 2016-02-29 NOTE — Telephone Encounter (Signed)
Caller name: Synetta Failnita Relationship to patient: Hospice Can be reached: [phone] Pharmacy:  Reason for call:  FYI:   States that patient did not like the Remeron so he will not take it anymore.  Hospice would like to increase his Zoloft to 75 mg. Plse adv

## 2016-02-29 NOTE — Telephone Encounter (Signed)
Edward's patient, forwarding to him and Clydie BraunKaren.

## 2016-03-01 MED ORDER — SERTRALINE HCL 50 MG PO TABS: ORAL_TABLET | ORAL | 3 refills | Status: DC

## 2016-03-01 NOTE — Telephone Encounter (Signed)
rx sertraline  sent to his pharmacy.

## 2016-03-03 ENCOUNTER — Telehealth: Payer: Self-pay | Admitting: Medical

## 2016-03-03 NOTE — Telephone Encounter (Signed)
Caller name: Relationship to patient: Self Can be reached: [phone]  Pharmacy:  Reason for call: Request refill on diazepam (VALIUM) 2 MG tablet [[id number]][[id number]] And albuterol (PROVENTIL HFA;VENTOLIN HFA) 108 (90 Base) MCG/ACT inhaler [[id number]][[id number]]

## 2016-03-03 NOTE — Telephone Encounter (Signed)
Please advise 

## 2016-03-04 MED ORDER — DIAZEPAM 2 MG PO TABS: ORAL_TABLET | ORAL | 2 refills | Status: DC

## 2016-03-04 MED ORDER — ALBUTEROL SULFATE HFA 108 (90 BASE) MCG/ACT IN AERS: 2.0000 | INHALATION_SPRAY | Freq: Four times a day (QID) | RESPIRATORY_TRACT | 2 refills | Status: DC | PRN

## 2016-03-04 NOTE — Telephone Encounter (Signed)
Please reactivate pulmonology referral. Coorindate with [name] or TregoScott with children regarding date that will work for them.

## 2016-03-04 NOTE — Telephone Encounter (Signed)
Also I think they asked for refill of albuterol. How frequently is he using the albuterol?

## 2016-03-04 NOTE — Telephone Encounter (Signed)
Pt rx of valium was prescribed. They can pick rx. Pt is elderly but he still needs to sign controlled med contract and give a uds. Also I wanted to remind pt, [name] and Scott(his children) that I still want [name] to see pulmonologist. So I will put that referral in/reactivate it.  Let them  Know we need to get  controlled med contract and uds. Otherwise I won't be able to prescribe the valium long term.

## 2016-03-04 NOTE — Telephone Encounter (Signed)
I spoke with the patients son and he states that he does not want the pulmonology referral at this time. He states that he will call back to set up a time for the pulmonology referral. I advised him that the Valium would be sent into the pharmacy. [name] was made aware of the situation.

## 2016-03-04 NOTE — Telephone Encounter (Signed)
Left message for patients Tom Duarte(Tom Duarte) son Rachel BoScott Milhoan to call back.

## 2016-03-29 ENCOUNTER — Institutional Professional Consult (permissible substitution): Payer: PRIVATE HEALTH INSURANCE | Admitting: Pulmonary Disease

## 2016-04-01 DIAGNOSIS — R634 Abnormal weight loss: Secondary | ICD-10-CM | POA: Diagnosis not present

## 2016-05-11 ENCOUNTER — Other Ambulatory Visit: Payer: Self-pay | Admitting: Medical

## 2016-06-17 ENCOUNTER — Telehealth: Payer: Self-pay | Admitting: *Deleted

## 2016-06-17 NOTE — Telephone Encounter (Signed)
[facility]  Last filled by MD on 02/11/16 - Discontinued 03/01/16: Rx request Denied; faxed to pharmacy/SLS 02/02 [name] 12:20 PM  Note    Caller name: Synetta Failnita Relationship to patient: Hospice Can be reached: [phone] Pharmacy: Reason for call: FYI:  States that patient did not like the Remeron so he will not take it anymore.  Hospice would like to increase his Zoloft to 75 mg. Plse adv     Synetta FailAnita  to [name]   12:20 PM  Hospice

## 2016-06-22 ENCOUNTER — Telehealth: Payer: Self-pay | Admitting: Medical

## 2016-06-22 NOTE — Telephone Encounter (Signed)
.  Contacted patient to schedule medicare wellness appointment, son states will speak with patient regarding appointment

## 2016-07-12 ENCOUNTER — Telehealth: Payer: Self-pay | Admitting: *Deleted

## 2016-07-12 MED ORDER — SERTRALINE HCL 50 MG PO TABS: ORAL_TABLET | ORAL | 3 refills | Status: DC

## 2016-07-12 NOTE — Telephone Encounter (Signed)
[facility] mg Last filled by MD on 05/19/16 Two different scripts were sent in: 1) for #30 & 1) for #14 with script Sig written as take [1] tablet 50 mg total po daily. This Rx Sig was changed on 03/01/2016 at Hospice request to: Take 1 1/2 tablet total 75 mg po daily. Do not see any reason between October & January prescriptions to account for change in dosing instructions. Please Advise on refills/SLS 02/27

## 2016-07-12 NOTE — Telephone Encounter (Signed)
I sent in rx of sertraline. I wrote dose to be total 75 mg a day. Confirm with pt that palliative care still seeing pt. If I remember they are likely the original prescriber. If palliative care not going out need to know. Any appointment with me should always be 30 minutes.

## 2016-07-13 NOTE — Telephone Encounter (Signed)
LMOM [Scott, pt's son] with contact name and number for return call, if needed RE: any needs for patient [medication, palliative care or other] at this time per provider instructions/SLS 02/28

## 2016-07-14 ENCOUNTER — Telehealth: Payer: Self-pay | Admitting: Medical

## 2016-07-14 MED ORDER — ALBUTEROL SULFATE HFA 108 (90 BASE) MCG/ACT IN AERS: 2.0000 | INHALATION_SPRAY | Freq: Four times a day (QID) | RESPIRATORY_TRACT | 2 refills | Status: DC | PRN

## 2016-07-14 NOTE — Telephone Encounter (Signed)
Caller name: Relationship to patient: Self Can be reached: [phone]  Ph[facility]acy[facility]  [id number] [place] - [address] [phone])     Reason for call: Daughter states that patients inhaler stop spraying and pharmacy will not replace it so they need a new Rx for VENTOLIN HFA 108 (90 Base) MCG/ACT inhaler [[id number]][[id number]]

## 2016-07-14 NOTE — Telephone Encounter (Signed)
Rx sent 

## 2016-07-26 ENCOUNTER — Telehealth: Payer: Self-pay | Admitting: Medical

## 2016-07-26 NOTE — Telephone Encounter (Signed)
-----   Message from Esperanza RichtersEdward Khamryn Calderone, PA-C sent at 12/31/2015  2:16 PM EDT ----- cta or mir of thoracic aorta in one year. Due to finding of dilation from most recent ct.

## 2016-08-29 ENCOUNTER — Ambulatory Visit (INDEPENDENT_AMBULATORY_CARE_PROVIDER_SITE_OTHER): Payer: Medicare Other | Admitting: Medical

## 2016-08-29 ENCOUNTER — Telehealth: Payer: Self-pay | Admitting: Medical

## 2016-08-29 ENCOUNTER — Encounter: Payer: Self-pay | Admitting: Medical

## 2016-08-29 VITALS — BP 160/80 | HR 67 | Temp 97.6°F | Resp 16 | Ht 68.0 in | Wt 118.0 lb

## 2016-08-29 DIAGNOSIS — F32A Depression, unspecified: Secondary | ICD-10-CM

## 2016-08-29 DIAGNOSIS — R635 Abnormal weight gain: Secondary | ICD-10-CM

## 2016-08-29 DIAGNOSIS — I10 Essential (primary) hypertension: Secondary | ICD-10-CM

## 2016-08-29 DIAGNOSIS — F329 Major depressive disorder, single episode, unspecified: Secondary | ICD-10-CM | POA: Diagnosis not present

## 2016-08-29 DIAGNOSIS — J449 Chronic obstructive pulmonary disease, unspecified: Secondary | ICD-10-CM

## 2016-08-29 MED ORDER — SERTRALINE HCL 50 MG PO TABS: ORAL_TABLET | ORAL | 5 refills | Status: DC

## 2016-08-29 MED ORDER — ALBUTEROL SULFATE HFA 108 (90 BASE) MCG/ACT IN AERS: 2.0000 | INHALATION_SPRAY | Freq: Four times a day (QID) | RESPIRATORY_TRACT | 3 refills | Status: AC | PRN

## 2016-08-29 MED ORDER — BUDESONIDE-FORMOTEROL FUMARATE 160-4.5 MCG/ACT IN AERO: 2.0000 | INHALATION_SPRAY | Freq: Two times a day (BID) | RESPIRATORY_TRACT | 5 refills | Status: DC

## 2016-08-29 NOTE — Patient Instructions (Addendum)
For your history of copd and smoking will rx symbicort and albuterol  For your bp elevated today at 160/80 advise using lisinopril 10 mg a day.(you should have rx at home per our records if not then notify us immediatly)  For depression refill your sertraline.  I am glad your weight has increased. You are at more healthy weight now.  cmp and cbc today.   Follow up at least in 6 months maybe sooner if lab abnormalities.  But we do need you to have nurse bp check in about 2 weeks to confirm blood pressure better than today.

## 2016-08-29 NOTE — Progress Notes (Signed)
Subjective:    Patient ID: Tom Duarte, male    DOB: 1928/12/01, 83 y.o.   MRN: [id number]  HPI  Pt in today with increased weight  from last visit.   Pt appetite is better recently. 14 lb weight gain since last visit with me.  Pt mood is improved but not great per daughter(but not expressing outward depression). Pt is on sertraline  50 mg tab(1.5 tab a day)  Pt has copd. He is still taking symbicort and albuterol.  Pt blood pressure. Is high today and family has not checked in a while.  Pt bp was lower in past when he was deydrated and not eating.  Pt no longer has home health or palliative care going out.  Pt has high blood pressure in the past. He has not been checking his blood pressure.  Pt is still smoking. But he has not quit. About half pack a week.     Review of Systems  Constitutional: Negative for chills, fatigue and fever.  Respiratory: Negative for chest tightness and wheezing.        Breathing stable presently.(in past refused referrals to pulmonologist)  Cardiovascular: Negative for chest pain and palpitations.  Gastrointestinal: Negative for abdominal pain, constipation, diarrhea and vomiting.  Musculoskeletal: Negative for back pain.  Skin: Negative for rash.  Neurological: Negative for dizziness, speech difficulty, weakness and light-headedness.  Hematological: Negative for adenopathy. Does not bruise/bleed easily.  Psychiatric/Behavioral: Positive for dysphoric mood. Negative for behavioral problems, confusion, sleep disturbance and suicidal ideas. The patient is not nervous/anxious.        Mood better now and stable.    Past Medical History:  Diagnosis Date  . Anxiety   . Cataract   . Depression    Years ago not current per patient  . Emphysema of lung (HCC)   . Hypertension      Social History   Social History  . Marital status: Divorced    Spouse name: N/A  . Number of children: N/A  . Years of education: N/A   Occupational  History  . Not on file.   Social History Main Topics  . Smoking status: Current Every Day Smoker  . Smokeless tobacco: Never Used  . Alcohol use 0.0 oz/week  . Drug use: Unknown  . Sexual activity: No   Other Topics Concern  . Not on file   Social History Narrative  . No narrative on file    Past Surgical History:  Procedure Laterality Date  . HERNIA REPAIR     rt side. year 2000.  Marland Kitchen HIP SURGERY     Joint replacement  . LEG SURGERY     Left    Family History  Problem Relation Age of Onset  . GER disease Mother   . Heart disease Father     No Known Allergies  Current Outpatient Prescriptions on File Prior to Visit  Medication Sig Dispense Refill  . aspirin 325 MG tablet Take 325 mg by mouth daily. 2 daily    . lisinopril (PRINIVIL,ZESTRIL) 10 MG tablet TAKE 1 TABLET (10 MG TOTAL) BY MOUTH DAILY. 90 tablet 3   No current facility-administered medications on file prior to visit.     BP (!) 160/80   Pulse 67   Temp 97.6 F (36.4 C) (Oral)   Resp 16   Ht  (1.727 m)   Wt 118 lb (53.5 kg)   SpO2 97%   BMI 17.94 kg/m  Objective:   Physical Exam   General- No acute distress. Pleasant patient.(still thin appearing but not severe as before) Prior was gaunt and on palpation of ribs could feel easily. Now has some obvious  increased mass  Neck- Full range of motion, no jvd Lungs- Clear, even and unlabored.(but shallow respirations) Heart- regular rate and rhythm. Neurologic- CNII- XII grossly intact. Abdomen-soft, non tender, +bs, no rebound or guarding.       Assessment & Plan:  For your history of copd and smoking will rx symbicort and albuterol  For your bp elevated today at 160/80 advise using lisinopril 10 mg a day.  For depression refill your sertraline.  I am glad your weight has increased. You are at more healthy weight now.  cmp and cbc today.   Follow up at least in 6 months maybe sooner if lab abnormalities.  But we do need  you to have nurse bp check in about 2 weeks to confirm blood pressure better than today.

## 2016-08-29 NOTE — Progress Notes (Signed)
Pre visit review using our clinic review tool, if applicable. No additional management support is needed unless otherwise documented below in the visit note. 

## 2016-08-29 NOTE — Telephone Encounter (Signed)
Opened in error

## 2016-08-30 LAB — COMPREHENSIVE METABOLIC PANEL
ALT: 9 U/L (ref 0–53)
AST: 18 U/L (ref 0–37)
Albumin: 4 g/dL (ref 3.5–5.2)
Alkaline Phosphatase: 64 U/L (ref 39–117)
BUN: 18 mg/dL (ref 6–23)
CALCIUM: 9.5 mg/dL (ref 8.4–10.5)
CHLORIDE: 104 meq/L (ref 96–112)
CO2: 28 meq/L (ref 19–32)
CREATININE: 0.93 mg/dL (ref 0.40–1.50)
GFR: 81.98 mL/min (ref >60.00)
GLUCOSE: 84 mg/dL (ref 70–99)
Potassium: 4.7 mEq/L (ref 3.5–5.1)
SODIUM: 139 meq/L (ref 135–145)
Total Bilirubin: 0.4 mg/dL (ref 0.2–1.2)
Total Protein: 6.9 g/dL (ref 6.0–8.3)

## 2016-08-30 LAB — CBC WITH DIFFERENTIAL/PLATELET
BASOS ABS: 0.1 10*3/uL (ref 0.0–0.1)
BASOS PCT: 1.1 % (ref 0.0–3.0)
EOS ABS: 0.2 10*3/uL (ref 0.0–0.7)
Eosinophils Relative: 2 % (ref 0.0–5.0)
HEMATOCRIT: 40.8 % (ref 39.0–52.0)
Hemoglobin: 13.4 g/dL (ref 13.0–17.0)
Lymphocytes Relative: 10.8 % — ABNORMAL LOW (ref 12.0–46.0)
Lymphs Abs: 0.9 10*3/uL (ref 0.7–4.0)
MCHC: 33 g/dL (ref 30.0–36.0)
MCV: 88.4 fl (ref 78.0–100.0)
Monocytes Absolute: 0.6 10*3/uL (ref 0.1–1.0)
Monocytes Relative: 6.9 % (ref 3.0–12.0)
NEUTROS PCT: 79.2 % — AB (ref 43.0–77.0)
Neutro Abs: 6.9 10*3/uL (ref 1.4–7.7)
Platelets: 266 10*3/uL (ref 150.0–400.0)
RBC: 4.61 Mil/uL (ref 4.22–5.81)
RDW: 14.5 % (ref 11.5–15.5)
WBC: 8.7 10*3/uL (ref 4.0–10.5)

## 2017-01-03 ENCOUNTER — Telehealth: Payer: Self-pay | Admitting: Medical

## 2017-01-04 NOTE — Telephone Encounter (Signed)
reviewed

## 2017-01-04 NOTE — Telephone Encounter (Signed)
Opened to review 

## 2017-01-23 ENCOUNTER — Telehealth: Payer: Self-pay | Admitting: Medical

## 2017-01-23 NOTE — Telephone Encounter (Signed)
Opened by accident

## 2017-02-05 ENCOUNTER — Telehealth: Payer: Self-pay | Admitting: Medical

## 2017-02-16 NOTE — Telephone Encounter (Signed)
Open to review.  

## 2017-03-26 ENCOUNTER — Telehealth: Payer: Self-pay | Admitting: Medical

## 2017-03-26 NOTE — Telephone Encounter (Signed)
Would you call patient and have him schedule appointment for evaluation.  I have not seen him in more than 6 months. Have him schedule a 30-minute appointment and have him scheduled for the first in the afternoon.  I want to discuss if he is willing to do reimaging studies of his CT his abdomen to visualize his aorta.  He had slight dilation of this in the past.  Radiologist recommended repeating this each year.  Would like to discuss this with both him and his daughter or son.

## 2017-03-27 NOTE — Telephone Encounter (Signed)
lvm on daughter Tom HoseGail Duarte [phone] and Tom CraneJean Duarte [phone] awaiting call back to schedule 30 minute follow up appointment preferable 1st appointment slot right after lunch as per PCP request

## 2017-03-27 NOTE — Telephone Encounter (Signed)
[name] (daughter) returned call and schedule appointment with PCP for 04/03/2017 at 1pm.

## 2017-03-27 NOTE — Telephone Encounter (Signed)
Please call and schedule appointment.

## 2017-04-03 ENCOUNTER — Ambulatory Visit (INDEPENDENT_AMBULATORY_CARE_PROVIDER_SITE_OTHER): Payer: Medicare Other | Admitting: Medical

## 2017-04-03 ENCOUNTER — Encounter: Payer: Self-pay | Admitting: Medical

## 2017-04-03 VITALS — BP 136/75 | HR 69 | Temp 97.4°F | Resp 16 | Wt 117.4 lb

## 2017-04-03 DIAGNOSIS — I779 Disorder of arteries and arterioles, unspecified: Secondary | ICD-10-CM | POA: Diagnosis not present

## 2017-04-03 DIAGNOSIS — E876 Hypokalemia: Secondary | ICD-10-CM

## 2017-04-03 DIAGNOSIS — R911 Solitary pulmonary nodule: Secondary | ICD-10-CM

## 2017-04-03 DIAGNOSIS — F329 Major depressive disorder, single episode, unspecified: Secondary | ICD-10-CM

## 2017-04-03 DIAGNOSIS — J449 Chronic obstructive pulmonary disease, unspecified: Secondary | ICD-10-CM | POA: Diagnosis not present

## 2017-04-03 DIAGNOSIS — Z23 Encounter for immunization: Secondary | ICD-10-CM

## 2017-04-03 DIAGNOSIS — F32A Depression, unspecified: Secondary | ICD-10-CM

## 2017-04-03 LAB — CBC WITH DIFFERENTIAL/PLATELET
Basophils Absolute: 0.1 10*3/uL (ref 0.0–0.1)
Basophils Relative: 0.9 % (ref 0.0–3.0)
Eosinophils Absolute: 0.4 10*3/uL (ref 0.0–0.7)
Eosinophils Relative: 4.3 % (ref 0.0–5.0)
HCT: 43.1 % (ref 39.0–52.0)
HEMOGLOBIN: 14.2 g/dL (ref 13.0–17.0)
Lymphocytes Relative: 11.7 % — ABNORMAL LOW (ref 12.0–46.0)
Lymphs Abs: 1.1 10*3/uL (ref 0.7–4.0)
MCHC: 33 g/dL (ref 30.0–36.0)
MCV: 89 fl (ref 78.0–100.0)
MONO ABS: 0.6 10*3/uL (ref 0.1–1.0)
MONOS PCT: 6.9 % (ref 3.0–12.0)
Neutro Abs: 6.9 10*3/uL (ref 1.4–7.7)
Neutrophils Relative %: 76.2 % (ref 43.0–77.0)
Platelets: 241 10*3/uL (ref 150.0–400.0)
RBC: 4.84 Mil/uL (ref 4.22–5.81)
RDW: 14.6 % (ref 11.5–15.5)
WBC: 9 10*3/uL (ref 4.0–10.5)

## 2017-04-03 LAB — COMPREHENSIVE METABOLIC PANEL
ALBUMIN: 4.2 g/dL (ref 3.5–5.2)
ALK PHOS: 69 U/L (ref 39–117)
ALT: 7 U/L (ref 0–53)
AST: 14 U/L (ref 0–37)
BUN: 20 mg/dL (ref 6–23)
CO2: 31 mEq/L (ref 19–32)
Calcium: 10.1 mg/dL (ref 8.4–10.5)
Chloride: 105 mEq/L (ref 96–112)
Creatinine, Ser: 1.1 mg/dL (ref 0.40–1.50)
GFR: 67.45 mL/min (ref >60.00)
Glucose, Bld: 103 mg/dL — ABNORMAL HIGH (ref 70–99)
POTASSIUM: 6.2 meq/L — AB (ref 3.5–5.1)
Sodium: 141 mEq/L (ref 135–145)
TOTAL PROTEIN: 7.2 g/dL (ref 6.0–8.3)
Total Bilirubin: 0.5 mg/dL (ref 0.2–1.2)

## 2017-04-03 NOTE — Progress Notes (Signed)
Subjective:    Patient ID: Tom RossettiDavid S Padula, male    DOB: 01-05-1929, 83 y.o.   MRN: [id number]  HPI  Pt mood is stable overall but slight decreased since his cat died. He has decreased his ensure recently. Prior to cat death he was eating overall moderately. Family is helping. Daughter with him today visits 3-4 times a week. Other sister visits on weekend.   Pt continues to smoke. He smokes 5-8 cigarettes a day. Pt is still on symbicort. Also has albuterol as well. In the past referred to pulmonologist but declined visit. Some dyspnea on exertion daily for months. Not acute or severe presently.   Flu vaccine today.  Review of Systems  Constitutional: Negative for chills, fatigue and fever.  HENT: Negative for congestion, ear pain, postnasal drip, rhinorrhea, sinus pressure and sinus pain.   Respiratory: Positive for shortness of breath. Negative for cough, chest tightness and wheezing.        Minimal activity.  Cardiovascular: Negative for chest pain and palpitations.  Gastrointestinal: Negative for abdominal pain, diarrhea, nausea and vomiting.  Musculoskeletal: Negative for back pain and myalgias.  Skin: Negative for rash.  Neurological: Negative for dizziness, weakness, numbness and headaches.  Hematological: Negative for adenopathy. Does not bruise/bleed easily.  Psychiatric/Behavioral: Positive for dysphoric mood. Negative for behavioral problems, confusion, self-injury, sleep disturbance and suicidal ideas. The patient is not nervous/anxious and is not hyperactive.      Past Medical History:  Diagnosis Date  . Anxiety   . Cataract   . Depression    Years ago not current per patient  . Emphysema of lung (HCC)   . Hypertension      Social History   Socioeconomic History  . Marital status: Divorced    Spouse name: Not on file  . Number of children: Not on file  . Years of education: Not on file  . Highest education level: Not on file  Social Needs  . Financial  resource strain: Not on file  . Food insecurity - worry: Not on file  . Food insecurity - inability: Not on file  . Transportation needs - medical: Not on file  . Transportation needs - non-medical: Not on file  Occupational History  . Not on file  Tobacco Use  . Smoking status: Current Every Day Smoker  . Smokeless tobacco: Never Used  Substance and Sexual Activity  . Alcohol use: Yes    Alcohol/week: 0.0 oz  . Drug use: Not on file  . Sexual activity: No  Other Topics Concern  . Not on file  Social History Narrative  . Not on file    Past Surgical History:  Procedure Laterality Date  . HERNIA REPAIR     rt side. year 2000.  Marland Kitchen. HIP SURGERY     Joint replacement  . LEG SURGERY     Left    Family History  Problem Relation Age of Onset  . GER disease Mother   . Heart disease Father     No Known Allergies  Current Outpatient Medications on File Prior to Visit  Medication Sig Dispense Refill  . albuterol (VENTOLIN HFA) 108 (90 Base) MCG/ACT inhaler Inhale 2 puffs into the lungs every 6 (six) hours as needed for wheezing or shortness of breath. 18 g 3  . aspirin 325 MG tablet Take 325 mg by mouth daily. 2 daily    . budesonide-formoterol (SYMBICORT) 160-4.5 MCG/ACT inhaler Inhale 2 puffs into the lungs 2 (two) times daily. 1  Inhaler 5  . sertraline (ZOLOFT) 50 MG tablet 1 and half tab po q day 45 tablet 5  . lisinopril (PRINIVIL,ZESTRIL) 10 MG tablet TAKE 1 TABLET (10 MG TOTAL) BY MOUTH DAILY. (Patient not taking: Reported on 04/03/2017) 90 tablet 3   No current facility-administered medications on file prior to visit.     BP 136/75   Pulse 69   Temp (!) 97.4 F (36.3 C) (Oral)   Resp 16   Wt 117 lb 6.4 oz (53.3 kg)   SpO2 96%   BMI 17.85 kg/m       Objective:   Physical Exam  General  Mental Status - Alert. General Appearance - Well groomed. Not in acute distress.(thin pt but overall weight is stable compared to last visit)  Skin Rashes- No  Rashes.  HEENT Head- Normal. Ear Auditory Canal - Left- Normal. Right - Normal.Tympanic Membrane- Left- Normal. Right- Normal. Eye Sclera/Conjunctiva- Left- Normal. Right- Normal. Nose & Sinuses Nasal Mucosa- Left-   Not Boggy and Congested. Right-  Not  Boggy and  Congested.Bilateral no  maxillary and  No frontal sinus pressure. Mouth & Throat Lips: Upper Lip- Normal: no dryness, cracking, pallor, cyanosis, or vesicular eruption. Lower Lip-Normal: no dryness, cracking, pallor, cyanosis or vesicular eruption. Buccal Mucosa- Bilateral- No Aphthous ulcers. Oropharynx- No Discharge or Erythema. Tonsils: Characteristics- Bilateral- No Erythema or Congestion. Size/Enlargement- Bilateral- No enlargement. Discharge- bilateral-None.  Neck Neck- Supple. No Masses.   Chest and Lung Exam Auscultation: Breath Sounds:- even and unlabored. But shallow respirations.  Cardiovascular Auscultation:Rythm- Regular, rate and rhythm. Murmurs & Other Heart Sounds:Ausculatation of the heart reveal- No Murmurs.  Lymphatic Head & Neck General Head & Neck Lymphatics: Bilateral: Description- No Localized lymphadenopathy.       Assessment & Plan:  Full history of COPD, continue your Symbicort and use albuterol as needed.  I am going to make another referral to pulmonologist again.  Please attend this appointment.  For history of pulmonary nodule and dilation of thoracic aorta, I am placing repeat CT of chest order in.  He will be contacted later regarding when to get that study done.  For mild depression presently, continue the sertraline.  If it appears you are getting more depressed with severe lack of appetite as  last year then please notify us and come in.  We got CBC today and will get a metabolic panel.  Particularly follow your potassium level as that has been low in the past.  Flu vaccine today and will try to arrange that you have a pneumonia vaccine on the day that you come back in for CT of  the chest.  Follow-up date to be determined after lab review.  Wallis Vancott, Ramon DredgeEdward, PA-C

## 2017-04-03 NOTE — Patient Instructions (Addendum)
Full history of COPD, continue your Symbicort and use albuterol as needed.  I am going to make another referral to pulmonologist again.  Please attend this appointment.  For history of pulmonary nodule and dilation of thoracic aorta, I am placing repeat CT of chest order in.  He will be contacted later regarding when to get that study done.  For mild depression presently, continue the sertraline.  If it appears you are getting more depressed with severe lack of appetite as  last year then please notify us and come in.  We got  CBC today and will get a metabolic panel.  Particularly follow your potassium level as that has been low in the past.  Flu vaccine today and will try to arrange that you have a pneumonia vaccine on the day that you come back in for CT of the chest.  Follow-up date to be determined after lab review.

## 2017-04-04 ENCOUNTER — Encounter: Payer: Self-pay | Admitting: Medical

## 2017-04-04 ENCOUNTER — Ambulatory Visit (INDEPENDENT_AMBULATORY_CARE_PROVIDER_SITE_OTHER): Payer: Medicare Other | Admitting: Medical

## 2017-04-04 VITALS — BP 122/76 | HR 97 | Temp 97.6°F | Resp 16 | Wt 117.8 lb

## 2017-04-04 DIAGNOSIS — E875 Hyperkalemia: Secondary | ICD-10-CM | POA: Diagnosis not present

## 2017-04-04 LAB — COMPREHENSIVE METABOLIC PANEL WITH GFR
ALT: 7 U/L (ref 0–53)
AST: 15 U/L (ref 0–37)
Albumin: 4 g/dL (ref 3.5–5.2)
Alkaline Phosphatase: 67 U/L (ref 39–117)
BUN: 21 mg/dL (ref 6–23)
CO2: 29 meq/L (ref 19–32)
Calcium: 9.6 mg/dL (ref 8.4–10.5)
Chloride: 105 meq/L (ref 96–112)
Creatinine, Ser: 1.11 mg/dL (ref 0.40–1.50)
GFR: 66.75 mL/min
Glucose, Bld: 93 mg/dL (ref 70–99)
Potassium: 4 meq/L (ref 3.5–5.1)
Sodium: 141 meq/L (ref 135–145)
Total Bilirubin: 0.6 mg/dL (ref 0.2–1.2)
Total Protein: 6.8 g/dL (ref 6.0–8.3)

## 2017-04-04 NOTE — Progress Notes (Signed)
Subjective:    Patient ID: Tom Duarte, male    DOB: [birth year], 83 y.o.   MRN: [id number]  HPI  Pt in for follow up since yesterday had high k level.  I wanted him to come in today for ekg and repeat cmp stat.  No chest pain and no palpitations.  See result note placed yesterday for conversation I had with daughter [name].     Review of Systems  Constitutional: Negative for chills, fatigue and fever.  HENT: Negative for congestion and ear pain.   Respiratory: Negative for cough, chest tightness, shortness of breath and wheezing.   Cardiovascular: Negative for chest pain and palpitations.  Gastrointestinal: Negative for abdominal pain, constipation, diarrhea, nausea and vomiting.  Musculoskeletal: Negative for back pain and gait problem.  Skin: Negative for rash.  Neurological: Negative for dizziness, syncope, speech difficulty, weakness, light-headedness and headaches.  Hematological: Negative for adenopathy. Does not bruise/bleed easily.  Psychiatric/Behavioral: Positive for dysphoric mood. Negative for behavioral problems, hallucinations, sleep disturbance and suicidal ideas. The patient is not nervous/anxious and is not hyperactive.        Mild decreased mood.   Past Medical History:  Diagnosis Date  . Anxiety   . Cataract   . Depression    Years ago not current per patient  . Emphysema of lung (HCC)   . Hypertension      Social History   Socioeconomic History  . Marital status: Divorced    Spouse name: Not on file  . Number of children: Not on file  . Years of education: Not on file  . Highest education level: Not on file  Social Needs  . Financial resource strain: Not on file  . Food insecurity - worry: Not on file  . Food insecurity - inability: Not on file  . Transportation needs - medical: Not on file  . Transportation needs - non-medical: Not on file  Occupational History  . Not on file  Tobacco Use  . Smoking status: Current Every Day Smoker  .  Smokeless tobacco: Never Used  Substance and Sexual Activity  . Alcohol use: Yes    Alcohol/week: 0.0 oz  . Drug use: Not on file  . Sexual activity: No  Other Topics Concern  . Not on file  Social History Narrative  . Not on file    Past Surgical History:  Procedure Laterality Date  . HERNIA REPAIR     rt side. year 2000.  Marland Kitchen. HIP SURGERY     Joint replacement  . LEG SURGERY     Left    Family History  Problem Relation Age of Onset  . GER disease Mother   . Heart disease Father     No Known Allergies  Current Outpatient Medications on File Prior to Visit  Medication Sig Dispense Refill  . albuterol (VENTOLIN HFA) 108 (90 Base) MCG/ACT inhaler Inhale 2 puffs into the lungs every 6 (six) hours as needed for wheezing or shortness of breath. 18 g 3  . aspirin 325 MG tablet Take 325 mg by mouth daily. 2 daily    . budesonide-formoterol (SYMBICORT) 160-4.5 MCG/ACT inhaler Inhale 2 puffs into the lungs 2 (two) times daily. 1 Inhaler 5  . lisinopril (PRINIVIL,ZESTRIL) 10 MG tablet TAKE 1 TABLET (10 MG TOTAL) BY MOUTH DAILY. (Patient not taking: Reported on 04/03/2017) 90 tablet 3  . sertraline (ZOLOFT) 50 MG tablet 1 and half tab po q day 45 tablet 5   No current facility-administered medications  on file prior to visit.     BP 122/76   Pulse 97   Temp 97.6 F (36.4 C) (Oral)   Resp 16   Wt 117 lb 12.8 oz (53.4 kg)   SpO2 94%   BMI 17.91 kg/m       Objective:   Physical Exam  General Mental Status- Alert. General Appearance- Not in acute distress.   Skin General: Color- Normal Color. Moisture- Normal Moisture.  Neck Carotid Arteries- Normal color. Moisture- Normal Moisture. No carotid bruits. No JVD.  Chest and Lung Exam Auscultation: Breath Sounds:-Normal.  Cardiovascular Auscultation:Rythm- Regular. Murmurs & Other Heart Sounds:Auscultation of the heart reveals- No Murmurs.    Neurologic Cranial Nerve exam:- CN III-XII intact(No nystagmus),  symmetric smile. Strength:- 5/5 equal and symmetric strength both upper and lower extremities.      Assessment & Plan:  For your high potassium on yesterday's lab, we did a EKG.  On review of the EKG I do not see obvious hyperkalemia type changes.  We will repeat the metabolic panel stat presently and see if potassium still elevated or if possibly blood draw yesterday had some hemolysis.  If the potassium level is increased as yesterday then the safest way to bring this down fast would be emergency department.  I apologize if we need to send you to the ED.  We discussed some secondary options that would not be ideal.  Benefits and risk of secondary options discussed.  Follow-up date to be determined after lab review.  Lafern Brinkley, Ramon DredgeEdward, PA-C   EKG today showed sinus rhythm with some artifact.  I did not see hyperkalemia EKG changes.  EKG compared to prior and it looks very similar.  However last year he did not have much artifact.

## 2017-04-04 NOTE — Patient Instructions (Addendum)
For your high potassium on yesterday's lab, we did a EKG.  On review of the EKG I do not see obvious hyperkalemia type changes.  We will repeat the metabolic panel stat presently and see if potassium still elevated or if possibly draw yesterday had some hemolysis.  If the potassium level is increased as yesterday then the safest way to bring this down fast would be emergency department.  I apologize if we need to send you to the ED.  We discussed some secondary options that would not be ideal.  Benefits and risk of secondary options discussed.  Follow-up date to be determined after lab review.

## 2017-04-10 ENCOUNTER — Ambulatory Visit (HOSPITAL_BASED_OUTPATIENT_CLINIC_OR_DEPARTMENT_OTHER)
Admission: RE | Admit: 2017-04-10 | Discharge: 2017-04-10 | Disposition: A | Discharge status: Home / Self Care | Payer: Medicare Other | Source: Ambulatory Visit | Attending: Medical | Admitting: Medical

## 2017-04-10 DIAGNOSIS — I712 Thoracic aortic aneurysm, without rupture: Secondary | ICD-10-CM | POA: Diagnosis not present

## 2017-04-10 DIAGNOSIS — I779 Disorder of arteries and arterioles, unspecified: Secondary | ICD-10-CM

## 2017-04-10 DIAGNOSIS — I700 Atherosclerosis of aorta: Secondary | ICD-10-CM | POA: Diagnosis not present

## 2017-04-10 DIAGNOSIS — J439 Emphysema, unspecified: Secondary | ICD-10-CM | POA: Insufficient documentation

## 2017-04-10 DIAGNOSIS — J929 Pleural plaque without asbestos: Secondary | ICD-10-CM | POA: Diagnosis not present

## 2017-04-10 DIAGNOSIS — R911 Solitary pulmonary nodule: Secondary | ICD-10-CM | POA: Diagnosis not present

## 2017-04-24 ENCOUNTER — Institutional Professional Consult (permissible substitution): Payer: PRIVATE HEALTH INSURANCE | Admitting: Pulmonary Disease

## 2017-05-09 ENCOUNTER — Other Ambulatory Visit: Payer: Self-pay

## 2017-05-09 ENCOUNTER — Inpatient Hospital Stay (HOSPITAL_COMMUNITY)
Admission: EM | Admit: 2017-05-09 | Discharge: 2017-05-11 | DRG: 189 | Disposition: A | Discharge status: Home / Self Care | Payer: Medicare Other | Attending: Internal Medicine | Admitting: Internal Medicine

## 2017-05-09 ENCOUNTER — Emergency Department (HOSPITAL_COMMUNITY): Payer: Medicare Other

## 2017-05-09 ENCOUNTER — Encounter (HOSPITAL_COMMUNITY): Payer: Self-pay | Admitting: Emergency Medicine

## 2017-05-09 DIAGNOSIS — S72002A Fracture of unspecified part of neck of left femur, initial encounter for closed fracture: Secondary | ICD-10-CM | POA: Diagnosis not present

## 2017-05-09 DIAGNOSIS — Z7951 Long term (current) use of inhaled steroids: Secondary | ICD-10-CM

## 2017-05-09 DIAGNOSIS — Z7982 Long term (current) use of aspirin: Secondary | ICD-10-CM

## 2017-05-09 DIAGNOSIS — J9611 Chronic respiratory failure with hypoxia: Principal | ICD-10-CM

## 2017-05-09 DIAGNOSIS — W010XXA Fall on same level from slipping, tripping and stumbling without subsequent striking against object, initial encounter: Secondary | ICD-10-CM | POA: Diagnosis present

## 2017-05-09 DIAGNOSIS — J438 Other emphysema: Secondary | ICD-10-CM | POA: Diagnosis not present

## 2017-05-09 DIAGNOSIS — S42202A Unspecified fracture of upper end of left humerus, initial encounter for closed fracture: Secondary | ICD-10-CM | POA: Diagnosis not present

## 2017-05-09 DIAGNOSIS — F329 Major depressive disorder, single episode, unspecified: Secondary | ICD-10-CM | POA: Diagnosis present

## 2017-05-09 DIAGNOSIS — F419 Anxiety disorder, unspecified: Secondary | ICD-10-CM | POA: Diagnosis present

## 2017-05-09 DIAGNOSIS — S299XXA Unspecified injury of thorax, initial encounter: Secondary | ICD-10-CM | POA: Diagnosis not present

## 2017-05-09 DIAGNOSIS — M25562 Pain in left knee: Secondary | ICD-10-CM

## 2017-05-09 DIAGNOSIS — I739 Peripheral vascular disease, unspecified: Secondary | ICD-10-CM | POA: Diagnosis not present

## 2017-05-09 DIAGNOSIS — S4982XA Other specified injuries of left shoulder and upper arm, initial encounter: Secondary | ICD-10-CM | POA: Diagnosis not present

## 2017-05-09 DIAGNOSIS — S0990XA Unspecified injury of head, initial encounter: Secondary | ICD-10-CM | POA: Diagnosis not present

## 2017-05-09 DIAGNOSIS — S42309A Unspecified fracture of shaft of humerus, unspecified arm, initial encounter for closed fracture: Secondary | ICD-10-CM | POA: Diagnosis present

## 2017-05-09 DIAGNOSIS — R0902 Hypoxemia: Secondary | ICD-10-CM | POA: Diagnosis not present

## 2017-05-09 DIAGNOSIS — W19XXXA Unspecified fall, initial encounter: Secondary | ICD-10-CM | POA: Diagnosis not present

## 2017-05-09 DIAGNOSIS — I10 Essential (primary) hypertension: Secondary | ICD-10-CM | POA: Diagnosis not present

## 2017-05-09 DIAGNOSIS — S199XXA Unspecified injury of neck, initial encounter: Secondary | ICD-10-CM | POA: Diagnosis not present

## 2017-05-09 DIAGNOSIS — S42292A Other displaced fracture of upper end of left humerus, initial encounter for closed fracture: Secondary | ICD-10-CM | POA: Diagnosis not present

## 2017-05-09 DIAGNOSIS — Z79899 Other long term (current) drug therapy: Secondary | ICD-10-CM

## 2017-05-09 DIAGNOSIS — M8580 Other specified disorders of bone density and structure, unspecified site: Secondary | ICD-10-CM | POA: Diagnosis present

## 2017-05-09 DIAGNOSIS — T148XXA Other injury of unspecified body region, initial encounter: Secondary | ICD-10-CM | POA: Diagnosis not present

## 2017-05-09 DIAGNOSIS — F17200 Nicotine dependence, unspecified, uncomplicated: Secondary | ICD-10-CM | POA: Diagnosis present

## 2017-05-09 LAB — CBC WITH DIFFERENTIAL/PLATELET
BASOS PCT: 0 %
Basophils Absolute: 0 10*3/uL (ref 0.0–0.1)
Eosinophils Absolute: 0 10*3/uL (ref 0.0–0.7)
Eosinophils Relative: 0 %
HEMATOCRIT: 41.3 % (ref 39.0–52.0)
Hemoglobin: 13.5 g/dL (ref 13.0–17.0)
LYMPHS PCT: 7 %
Lymphs Abs: 1.1 10*3/uL (ref 0.7–4.0)
MCH: 29 pg (ref 26.0–34.0)
MCHC: 32.7 g/dL (ref 30.0–36.0)
MCV: 88.6 fL (ref 78.0–100.0)
MONO ABS: 0.5 10*3/uL (ref 0.1–1.0)
MONOS PCT: 3 %
NEUTROS ABS: 14.1 10*3/uL — AB (ref 1.7–7.7)
Neutrophils Relative %: 90 %
Platelets: 196 10*3/uL (ref 150–400)
RBC: 4.66 MIL/uL (ref 4.22–5.81)
RDW: 14.1 % (ref 11.5–15.5)
WBC: 15.7 10*3/uL — ABNORMAL HIGH (ref 4.0–10.5)

## 2017-05-09 LAB — COMPREHENSIVE METABOLIC PANEL
ALBUMIN: 4.2 g/dL (ref 3.5–5.0)
ALK PHOS: 82 U/L (ref 38–126)
ALT: 19 U/L (ref 17–63)
ANION GAP: 10 (ref 5–15)
AST: 34 U/L (ref 15–41)
BUN: 21 mg/dL — ABNORMAL HIGH (ref 6–20)
CALCIUM: 9.4 mg/dL (ref 8.9–10.3)
CO2: 24 mmol/L (ref 22–32)
Chloride: 106 mmol/L (ref 101–111)
Creatinine, Ser: 1.13 mg/dL (ref 0.61–1.24)
GFR calc Af Amer: >60 mL/min (ref >60)
GFR calc non Af Amer: 57 mL/min — ABNORMAL LOW (ref >60)
GLUCOSE: 118 mg/dL — AB (ref 65–99)
Potassium: 4.5 mmol/L (ref 3.5–5.1)
SODIUM: 140 mmol/L (ref 135–145)
Total Bilirubin: 0.4 mg/dL (ref 0.3–1.2)
Total Protein: 7.5 g/dL (ref 6.5–8.1)

## 2017-05-09 LAB — URINALYSIS, ROUTINE W REFLEX MICROSCOPIC
BACTERIA UA: NONE SEEN
Bilirubin Urine: NEGATIVE
Glucose, UA: NEGATIVE mg/dL
Ketones, ur: 5 mg/dL — AB
Leukocytes, UA: NEGATIVE
Nitrite: NEGATIVE
Protein, ur: 30 mg/dL — AB
SPECIFIC GRAVITY, URINE: 1.023 (ref 1.005–1.030)
pH: 5 (ref 5.0–8.0)

## 2017-05-09 LAB — I-STAT TROPONIN, ED: TROPONIN I, POC: 0.03 ng/mL (ref 0.00–0.08)

## 2017-05-09 MED ORDER — ORAL CARE MOUTH RINSE
15.0000 mL | Freq: Two times a day (BID) | OROMUCOSAL | Status: DC
  Administered 2017-05-09 – 2017-05-11 (×3): 15 mL via OROMUCOSAL

## 2017-05-09 MED ORDER — ALBUTEROL SULFATE (2.5 MG/3ML) 0.083% IN NEBU: 2.5000 mg | INHALATION_SOLUTION | RESPIRATORY_TRACT | Status: DC | PRN

## 2017-05-09 MED ORDER — ALBUTEROL SULFATE (2.5 MG/3ML) 0.083% IN NEBU
5.0000 mg | INHALATION_SOLUTION | Freq: Once | RESPIRATORY_TRACT | Status: AC
  Administered 2017-05-09: 5 mg via RESPIRATORY_TRACT
  Filled 2017-05-09: qty 6

## 2017-05-09 MED ORDER — ALBUTEROL SULFATE (2.5 MG/3ML) 0.083% IN NEBU: 2.5000 mg | INHALATION_SOLUTION | Freq: Four times a day (QID) | RESPIRATORY_TRACT | Status: DC

## 2017-05-09 MED ORDER — IPRATROPIUM-ALBUTEROL 0.5-2.5 (3) MG/3ML IN SOLN
3.0000 mL | Freq: Three times a day (TID) | RESPIRATORY_TRACT | Status: DC
  Administered 2017-05-10 (×2): 3 mL via RESPIRATORY_TRACT
  Filled 2017-05-09 (×3): qty 3

## 2017-05-09 MED ORDER — SODIUM CHLORIDE 0.9 % IV SOLN
250.0000 mL | INTRAVENOUS | Status: DC | PRN
Start: 2017-05-09 — End: 2017-05-11

## 2017-05-09 MED ORDER — SODIUM CHLORIDE 0.9% FLUSH
3.0000 mL | Freq: Two times a day (BID) | INTRAVENOUS | Status: DC
  Administered 2017-05-09 – 2017-05-11 (×2): 3 mL via INTRAVENOUS

## 2017-05-09 MED ORDER — SODIUM CHLORIDE 0.9% FLUSH: 3.0000 mL | INTRAVENOUS | Status: DC | PRN

## 2017-05-09 MED ORDER — ASPIRIN 325 MG PO TABS
325.0000 mg | ORAL_TABLET | Freq: Every day | ORAL | Status: DC
  Administered 2017-05-09 – 2017-05-11 (×3): 325 mg via ORAL
  Filled 2017-05-09 (×3): qty 1

## 2017-05-09 MED ORDER — IPRATROPIUM-ALBUTEROL 0.5-2.5 (3) MG/3ML IN SOLN
3.0000 mL | Freq: Four times a day (QID) | RESPIRATORY_TRACT | Status: DC
  Administered 2017-05-09: 3 mL via RESPIRATORY_TRACT
  Filled 2017-05-09: qty 3

## 2017-05-09 MED ORDER — HYDROCODONE-ACETAMINOPHEN 5-325 MG PO TABS
1.0000 | ORAL_TABLET | Freq: Once | ORAL | Status: AC
  Administered 2017-05-09: 1 via ORAL
  Filled 2017-05-09: qty 1

## 2017-05-09 MED ORDER — ALBUTEROL SULFATE HFA 108 (90 BASE) MCG/ACT IN AERS: 2.0000 | INHALATION_SPRAY | Freq: Four times a day (QID) | RESPIRATORY_TRACT | Status: DC | PRN

## 2017-05-09 MED ORDER — IPRATROPIUM BROMIDE 0.02 % IN SOLN: 0.5000 mg | Freq: Four times a day (QID) | RESPIRATORY_TRACT | Status: DC

## 2017-05-09 MED ORDER — FENTANYL CITRATE (PF) 100 MCG/2ML IJ SOLN
50.0000 ug | Freq: Once | INTRAMUSCULAR | Status: DC
  Filled 2017-05-09: qty 2

## 2017-05-09 NOTE — ED Triage Notes (Signed)
Pt from independent living facility with c/o right shoulder pain following fall. Pt denies LOC. Pt states he tripped over carpet. Pt also has skin tear to right forearm and left forehead. Bleeding controlled. No blood thinner

## 2017-05-09 NOTE — ED Notes (Addendum)
EDP adjusted [place] O2 to 1L from 2L. Oxygen sat dropped to 85% and oxygen was adjusted back to 2L

## 2017-05-09 NOTE — ED Notes (Signed)
ED TO INPATIENT HANDOFF REPORT  Name/Age/Gender Tom Duarte 83 y.o. male  Code Status Code Status History    This patient does not have a recorded code status. Please follow your organizational policy for patients in this situation.      Home/SNF/Other Skilled nursing facility  Chief Complaint fal, shoulder pain  Level of Care/Admitting Diagnosis ED Disposition    ED Disposition Condition [facility] Area: [place] [[id number]]  Level of Care: Med-Surg [16]  Diagnosis: Hypoxia [412878]  Admitting Physician: Phillips Grout [[id number]]  Attending Physician: Derrill Kay A [[id number]]  PT Class (Do Not Modify): Observation [104]  PT Acc Code (Do Not Modify): Observation [10022]       Medical History Past Medical History:  Diagnosis Date  . Anxiety   . Cataract   . Depression    Years ago not current per patient  . Emphysema of lung ([place])   . Hypertension     Allergies No Known Allergies  IV Location/Drains/Wounds Patient Lines/Drains/Airways Status   Active Line/Drains/Airways    Name:   Placement date:   Placement time:   Site:   Days:   Peripheral IV 05/09/17 Right Forearm   05/09/17    1211    Forearm   less than 1          Labs/Imaging Results for orders placed or performed during the hospital encounter of 05/09/17 (from the past 48 hour(s))  Comprehensive metabolic panel     Status: Abnormal   Collection Time: 05/09/17 12:00 PM  Result Value Ref Range   Sodium 140 135 - 145 mmol/L   Potassium 4.5 3.5 - 5.1 mmol/L   Chloride 106 101 - 111 mmol/L   CO2 24 22 - 32 mmol/L   Glucose, Bld 118 (H) 65 - 99 mg/dL   BUN 21 (H) 6 - 20 mg/dL   Creatinine, Ser 1.13 0.61 - 1.24 mg/dL   Calcium 9.4 8.9 - 10.3 mg/dL   Total Protein 7.5 6.5 - 8.1 g/dL   Albumin 4.2 3.5 - 5.0 g/dL   AST 34 15 - 41 U/L   ALT 19 17 - 63 U/L   Alkaline Phosphatase 82 38 - 126 U/L   Total Bilirubin 0.4 0.3 - 1.2 mg/dL   GFR calc non Af Amer 57 (L) >60  mL/min   GFR calc Af Amer >60 >60 mL/min    Comment: (NOTE) The eGFR has been calculated using the CKD EPI equation. This calculation has not been validated in all clinical situations. eGFR's persistently <60 mL/min signify possible Chronic Kidney Disease.    Anion gap 10 5 - 15  CBC with Differential     Status: Abnormal   Collection Time: 05/09/17 12:00 PM  Result Value Ref Range   WBC 15.7 (H) 4.0 - 10.5 K/uL   RBC 4.66 4.22 - 5.81 MIL/uL   Hemoglobin 13.5 13.0 - 17.0 g/dL   HCT 41.3 39.0 - 52.0 %   MCV 88.6 78.0 - 100.0 fL   MCH 29.0 26.0 - 34.0 pg   MCHC 32.7 30.0 - 36.0 g/dL   RDW 14.1 11.5 - 15.5 %   Platelets 196 150 - 400 K/uL   Neutrophils Relative % 90 %   Neutro Abs 14.1 (H) 1.7 - 7.7 K/uL   Lymphocytes Relative 7 %   Lymphs Abs 1.1 0.7 - 4.0 K/uL   Monocytes Relative 3 %   Monocytes Absolute 0.5 0.1 - 1.0 K/uL  Eosinophils Relative 0 %   Eosinophils Absolute 0.0 0.0 - 0.7 K/uL   Basophils Relative 0 %   Basophils Absolute 0.0 0.0 - 0.1 K/uL  I-stat troponin, ED     Status: None   Collection Time: 05/09/17 12:15 PM  Result Value Ref Range   Troponin i, poc 0.03 0.00 - 0.08 ng/mL   Comment 3            Comment: Due to the release kinetics of cTnI, a negative result within the first hours of the onset of symptoms does not rule out myocardial infarction with certainty. If myocardial infarction is still suspected, repeat the test at appropriate intervals.    Dg Chest 2 View  Result Date: 05/09/2017 CLINICAL DATA:  Fall this morning with left shoulder pain. EXAM: CHEST  2 VIEW COMPARISON:  01/29/2016 FINDINGS: Lungs are adequately inflated with stable left basilar scarring. No consolidation, effusion or pneumothorax. Cardiomediastinal silhouette is within normal. Displaced left humeral neck fracture. IMPRESSION: No acute cardiopulmonary disease. Displaced left femoral neck fracture. Electronically Signed   By: Marin Olp M.D.   On: 05/09/2017 12:21   Ct  Head Wo Contrast  Result Date: 05/09/2017 CLINICAL DATA:  The patient suffered a trip and fall on carpet today with a blow to the left forehead. Initial encounter. EXAM: CT HEAD WITHOUT CONTRAST CT CERVICAL SPINE WITHOUT CONTRAST TECHNIQUE: Multidetector CT imaging of the head and cervical spine was performed following the standard protocol without intravenous contrast. Multiplanar CT image reconstructions of the cervical spine were also generated. COMPARISON:  None. FINDINGS: CT HEAD FINDINGS Brain: Cortical atrophy and chronic microvascular ischemic change are seen. There is no evidence of acute abnormality including hemorrhage, infarct, mass lesion, mass effect, midline shift or abnormal extra-axial fluid collection. No hydrocephalus or pneumocephalus. Vascular: Atherosclerosis noted. Skull: Intact. Sinuses/Orbits: Negative. Other: None. CT CERVICAL SPINE FINDINGS Alignment: Maintained. Skull base and vertebrae: No acute fracture. No primary bone lesion or focal pathologic process. Soft tissues and spinal canal: No prevertebral fluid or swelling. No visible canal hematoma. Disc levels: Multilevel loss of disc space height appears worst at C5-6 and C6-7. Scattered facet degenerative disease is also identified. Upper chest: Emphysematous disease is seen in the apices. Other: None. IMPRESSION: No acute abnormality head or cervical spine. Atrophy and chronic microvascular ischemic change. Atherosclerosis. Cervical spondylosis. Emphysema. Electronically Signed   By: Inge Rise M.D.   On: 05/09/2017 13:26   Ct Cervical Spine Wo Contrast  Result Date: 05/09/2017 CLINICAL DATA:  The patient suffered a trip and fall on carpet today with a blow to the left forehead. Initial encounter. EXAM: CT HEAD WITHOUT CONTRAST CT CERVICAL SPINE WITHOUT CONTRAST TECHNIQUE: Multidetector CT imaging of the head and cervical spine was performed following the standard protocol without intravenous contrast. Multiplanar CT  image reconstructions of the cervical spine were also generated. COMPARISON:  None. FINDINGS: CT HEAD FINDINGS Brain: Cortical atrophy and chronic microvascular ischemic change are seen. There is no evidence of acute abnormality including hemorrhage, infarct, mass lesion, mass effect, midline shift or abnormal extra-axial fluid collection. No hydrocephalus or pneumocephalus. Vascular: Atherosclerosis noted. Skull: Intact. Sinuses/Orbits: Negative. Other: None. CT CERVICAL SPINE FINDINGS Alignment: Maintained. Skull base and vertebrae: No acute fracture. No primary bone lesion or focal pathologic process. Soft tissues and spinal canal: No prevertebral fluid or swelling. No visible canal hematoma. Disc levels: Multilevel loss of disc space height appears worst at C5-6 and C6-7. Scattered facet degenerative disease is also identified. Upper chest: Emphysematous  disease is seen in the apices. Other: None. IMPRESSION: No acute abnormality head or cervical spine. Atrophy and chronic microvascular ischemic change. Atherosclerosis. Cervical spondylosis. Emphysema. Electronically Signed   By: Inge Rise M.D.   On: 05/09/2017 13:26   Dg Shoulder Left  Result Date: 05/09/2017 CLINICAL DATA:  Left shoulder pain due to a fall this morning. EXAM: LEFT SHOULDER - 2+ VIEW COMPARISON:  None. FINDINGS: The patient has a fracture of the proximal left humerus. There is 1 shaft width anterior displacement and fragment override of approximately 3.5 cm. The humeral head is rotated medially due to retraction of the rotator cuff. The acromioclavicular joint is intact. Imaged lung parenchyma is clear. Aortic atherosclerosis noted. IMPRESSION: Displaced fracture proximal left humerus as described above. Electronically Signed   By: Inge Rise M.D.   On: 05/09/2017 12:12    Pending Labs Unresulted Labs (From admission, onward)   Start     Ordered   05/09/17 1135  Urinalysis, Routine w reflex microscopic  STAT,   STAT      05/09/17 1135      Vitals/Pain Today's Vitals   05/09/17 1500 05/09/17 1507 05/09/17 1525 05/09/17 1526  BP:   97/65   Pulse: (!) 130  (!) 116   Resp: (!) 27  (!) 23   Temp:      TempSrc:      SpO2: (!) 87% 95% 94%   PainSc:   4  4     Isolation Precautions No active isolations  Medications Medications  fentaNYL (SUBLIMAZE) injection 50 mcg (50 mcg Intravenous Not Given 05/09/17 1423)  albuterol (PROVENTIL) (2.5 MG/3ML) 0.083% nebulizer solution 5 mg (5 mg Nebulization Given 05/09/17 1415)  HYDROcodone-acetaminophen (NORCO/VICODIN) 5-325 MG per tablet 1 tablet (1 tablet Oral Given 05/09/17 1427)    Mobility walks with person assist

## 2017-05-09 NOTE — ED Notes (Signed)
Oxygen sat 85%. Pt placed back on O2. Fentanyl held until oxygen saturation returns to normal limits. Pt currently denies pain

## 2017-05-09 NOTE — ED Provider Notes (Signed)
[facility]-EMERGENCY DEPT Provider Note   CSN: [id number] Arrival date & time: 05/09/17  1117     History   Chief Complaint Chief Complaint  Patient presents with  . Fall  . Shoulder Pain    HPI Tom RossettiDavid S Duarte is a 83 y.o. male.  The history is provided by the patient and the EMS personnel. No language interpreter was used.  Fall   Shoulder Pain      Tom RossettiDavid S Duarte is a 83 y.o. male who presents to the Emergency Department complaining of fall.  He presents via EMS for evaluation of injuries following a fall.  He was walking inside and tripped over the carpet.  He fell to the ground, striking his left shoulder and head.  There is no loss of consciousness.  He reports pain in his shoulder, no additional pain.  No recent illnesses.  No fevers, chest pain, nausea, vomiting.  Symptoms are moderate and constant nature.  His daughter notes recent SOB/DOE (patient denies).  She states that he has significant difficulty with ambulating distances and becomes weak and winded.   Past Medical History:  Diagnosis Date  . Anxiety   . Cataract   . Depression    Years ago not current per patient  . Emphysema of lung ([facility])   . Hypertension     Patient Active Problem List   Diagnosis Date Noted  . FTT (failure to thrive) in adult 01/29/2016  . HTN (hypertension) 06/05/2014  . Other emphysema ([facility]) 06/05/2014    Past Surgical History:  Procedure Laterality Date  . HERNIA REPAIR     rt side. year 2000.  Marland Kitchen. HIP SURGERY     Joint replacement  . LEG SURGERY     Left       Home Medications    Prior to Admission medications   Medication Sig Start Date End Date Taking? Authorizing Provider  albuterol (VENTOLIN HFA) 108 (90 Base) MCG/ACT inhaler Inhale 2 puffs into the lungs every 6 (six) hours as needed for wheezing or shortness of breath. 08/29/16   Saguier, Ramon DredgeEdward, PA-C  aspirin 325 MG tablet Take 325 mg by mouth daily. 2 daily    [provider]    budesonide-formoterol (SYMBICORT) 160-4.5 MCG/ACT inhaler Inhale 2 puffs into the lungs 2 (two) times daily. 08/29/16   Saguier, Ramon DredgeEdward, PA-C  lisinopril (PRINIVIL,ZESTRIL) 10 MG tablet TAKE 1 TABLET (10 MG TOTAL) BY MOUTH DAILY. Patient not taking: Reported on 04/03/2017 05/19/16   Saguier, Ramon DredgeEdward, PA-C  sertraline (ZOLOFT) 50 MG tablet 1 and half tab po q day 08/29/16   Saguier, Ramon DredgeEdward, PA-C    Family History Family History  Problem Relation Age of Onset  . GER disease Mother   . Heart disease Father     Social History Social History   Tobacco Use  . Smoking status: Current Every Day Smoker  . Smokeless tobacco: Never Used  Substance Use Topics  . Alcohol use: Yes    Alcohol/week: 0.0 oz  . Drug use: Not on file     Allergies   Patient has no known allergies.   Review of Systems Review of Systems  All other systems reviewed and are negative.    Physical Exam Updated Vital Signs SpO2 96%   Physical Exam  Constitutional: He is oriented to person, place, and time. He appears well-developed and well-nourished.  HENT:  Head: Normocephalic.  Abrasion to the left parietal scalp  Cardiovascular: Normal rate and regular rhythm.  No  murmur heard. Pulmonary/Chest: Effort normal. No respiratory distress.  Crackles in the left lung base  Abdominal: Soft. There is no tenderness. There is no rebound and no guarding.  Musculoskeletal:  Tenderness and deformity to the left shoulder.  2+ DP pulses bilaterally.  5 out of 5 grip strength in bilateral hands.  Skin tear to the dorsal left hand without any local tenderness.  Full range of motion intact throughout the hand.  Abrasion on the right wrist with flexion extension intact and no local tenderness.  Neurological: He is alert and oriented to person, place, and time.  Skin: Skin is warm and dry.  Psychiatric: He has a normal mood and affect. His behavior is normal.  Nursing note and vitals reviewed.    ED Treatments / Results   Labs (all labs ordered are listed, but only abnormal results are displayed) Labs Reviewed  COMPREHENSIVE METABOLIC PANEL  CBC WITH DIFFERENTIAL/PLATELET  URINALYSIS, ROUTINE W REFLEX MICROSCOPIC    EKG  EKG Interpretation None       Radiology No results found.  Procedures Procedures (including critical care time)  Medications Ordered in ED Medications - No data to display   Initial Impression / Assessment and Plan / ED Course  I have reviewed the triage vital signs and the nursing notes.  Pertinent labs & imaging results that were available during my care of the patient were reviewed by me and considered in my medical decision making (see chart for details).     Pt here for evaluation of injuries following a fall.  He has a left shoulder deformity, NVI intact.  Xray with proximal humerus fracture.  D/w Dr. Aundria Rudogers with Orthopedics - recommends shoulder immobilizer and outpatient follow up.  In terms of fall - pt does have progressive weakness/doe.  He has hypoxia in ED to 85% on room air with rest.  No respiratory distress.  No significant improvement with nebulizer treatment.  Medicine consulted for obs admission re fall, hypoxia, PT eval re humerus fracture and patient's mobility.    Final Clinical Impressions(s) / ED Diagnoses   Final diagnoses:  None    ED Discharge Orders    None       Tilden Fossaees, Kenadi Miltner, MD 05/09/17 1758

## 2017-05-09 NOTE — Consult Note (Signed)
ORTHOPAEDIC CONSULTATION  REQUESTING PHYSICIAN: Haydee Monicaavid, Rachal A, MD  PCP:  Esperanza RichtersSaguier, Edward, PA-C  Chief Complaint: Left proximal humerus fracture.  HPI: Tom RossettiDavid S Duarte is a 83 y.o. male who complains of shortness of breath in addition to left shoulder pain.  He had a fall prior to arrival to the emergency department.  He was found to have comminuted left proximal humerus fracture, in addition to having hypoxemia on room air at 85%.  He was evaluated in the emergency department and thought not appropriate to discharge home due to the hypoxia.  I was consulted for management recommendations on the left proximal humerus fracture.  Patient states he is right-hand dominant and independent with ADLs at baseline.  He is somewhat of a poor historian however.  Much of the HPI was obtained through chart review and discussion with the emergency department provider.  Past Medical History:  Diagnosis Date  . Anxiety   . Cataract   . Depression    Years ago not current per patient  . Emphysema of lung ([facility])   . Hypertension    Past Surgical History:  Procedure Laterality Date  . HERNIA REPAIR     rt side. year 2000.  Marland Kitchen. HIP SURGERY     Joint replacement  . LEG SURGERY     Left   Social History   Socioeconomic History  . Marital status: Divorced    Spouse name: None  . Number of children: None  . Years of education: None  . Highest education level: None  Social Needs  . Financial resource strain: None  . Food insecurity - worry: None  . Food insecurity - inability: None  . Transportation needs - medical: None  . Transportation needs - non-medical: None  Occupational History  . None  Tobacco Use  . Smoking status: Current Every Day Smoker  . Smokeless tobacco: Never Used  Substance and Sexual Activity  . Alcohol use: Yes    Alcohol/week: 0.0 oz  . Drug use: None  . Sexual activity: No  Other Topics Concern  . None  Social History Narrative  . None   Family History    Problem Relation Age of Onset  . GER disease Mother   . Heart disease Father    No Known Allergies Prior to Admission medications   Medication Sig Start Date End Date Taking? Authorizing Provider  albuterol (VENTOLIN HFA) 108 (90 Base) MCG/ACT inhaler Inhale 2 puffs into the lungs every 6 (six) hours as needed for wheezing or shortness of breath. 08/29/16  Yes Saguier, Ramon DredgeEdward, PA-C  budesonide-formoterol (SYMBICORT) 160-4.5 MCG/ACT inhaler Inhale 2 puffs into the lungs 2 (two) times daily. 08/29/16  Yes Saguier, Ramon DredgeEdward, PA-C  aspirin 325 MG tablet Take 325 mg by mouth daily. 2 daily    [provider]  lisinopril (PRINIVIL,ZESTRIL) 10 MG tablet TAKE 1 TABLET (10 MG TOTAL) BY MOUTH DAILY. Patient not taking: Reported on 04/03/2017 05/19/16   Saguier, Ramon DredgeEdward, PA-C  sertraline (ZOLOFT) 50 MG tablet 1 and half tab po q day Patient taking differently: Take 50 mg by mouth See admin instructions. 50 mg in morning  And 25 mg QHS 08/29/16   Esperanza RichtersSaguier, Edward, PA-C   Dg Chest 2 View  Result Date: 05/09/2017 CLINICAL DATA:  Fall this morning with left shoulder pain. EXAM: CHEST  2 VIEW COMPARISON:  01/29/2016 FINDINGS: Lungs are adequately inflated with stable left basilar scarring. No consolidation, effusion or pneumothorax. Cardiomediastinal silhouette is within normal. Displaced left humeral neck  fracture. IMPRESSION: No acute cardiopulmonary disease. Displaced left femoral neck fracture. Electronically Signed   By: Elberta Fortisaniel  Boyle M.D.   On: 05/09/2017 12:21   Ct Head Wo Contrast  Result Date: 05/09/2017 CLINICAL DATA:  The patient suffered a trip and fall on carpet today with a blow to the left forehead. Initial encounter. EXAM: CT HEAD WITHOUT CONTRAST CT CERVICAL SPINE WITHOUT CONTRAST TECHNIQUE: Multidetector CT imaging of the head and cervical spine was performed following the standard protocol without intravenous contrast. Multiplanar CT image reconstructions of the cervical spine were also  generated. COMPARISON:  None. FINDINGS: CT HEAD FINDINGS Brain: Cortical atrophy and chronic microvascular ischemic change are seen. There is no evidence of acute abnormality including hemorrhage, infarct, mass lesion, mass effect, midline shift or abnormal extra-axial fluid collection. No hydrocephalus or pneumocephalus. Vascular: Atherosclerosis noted. Skull: Intact. Sinuses/Orbits: Negative. Other: None. CT CERVICAL SPINE FINDINGS Alignment: Maintained. Skull base and vertebrae: No acute fracture. No primary bone lesion or focal pathologic process. Soft tissues and spinal canal: No prevertebral fluid or swelling. No visible canal hematoma. Disc levels: Multilevel loss of disc space height appears worst at C5-6 and C6-7. Scattered facet degenerative disease is also identified. Upper chest: Emphysematous disease is seen in the apices. Other: None. IMPRESSION: No acute abnormality head or cervical spine. Atrophy and chronic microvascular ischemic change. Atherosclerosis. Cervical spondylosis. Emphysema. Electronically Signed   By: Drusilla Kannerhomas  Dalessio M.D.   On: 05/09/2017 13:26   Ct Cervical Spine Wo Contrast  Result Date: 05/09/2017 CLINICAL DATA:  The patient suffered a trip and fall on carpet today with a blow to the left forehead. Initial encounter. EXAM: CT HEAD WITHOUT CONTRAST CT CERVICAL SPINE WITHOUT CONTRAST TECHNIQUE: Multidetector CT imaging of the head and cervical spine was performed following the standard protocol without intravenous contrast. Multiplanar CT image reconstructions of the cervical spine were also generated. COMPARISON:  None. FINDINGS: CT HEAD FINDINGS Brain: Cortical atrophy and chronic microvascular ischemic change are seen. There is no evidence of acute abnormality including hemorrhage, infarct, mass lesion, mass effect, midline shift or abnormal extra-axial fluid collection. No hydrocephalus or pneumocephalus. Vascular: Atherosclerosis noted. Skull: Intact. Sinuses/Orbits:  Negative. Other: None. CT CERVICAL SPINE FINDINGS Alignment: Maintained. Skull base and vertebrae: No acute fracture. No primary bone lesion or focal pathologic process. Soft tissues and spinal canal: No prevertebral fluid or swelling. No visible canal hematoma. Disc levels: Multilevel loss of disc space height appears worst at C5-6 and C6-7. Scattered facet degenerative disease is also identified. Upper chest: Emphysematous disease is seen in the apices. Other: None. IMPRESSION: No acute abnormality head or cervical spine. Atrophy and chronic microvascular ischemic change. Atherosclerosis. Cervical spondylosis. Emphysema. Electronically Signed   By: Drusilla Kannerhomas  Dalessio M.D.   On: 05/09/2017 13:26   Dg Shoulder Left  Result Date: 05/09/2017 CLINICAL DATA:  Left shoulder pain due to a fall this morning. EXAM: LEFT SHOULDER - 2+ VIEW COMPARISON:  None. FINDINGS: The patient has a fracture of the proximal left humerus. There is 1 shaft width anterior displacement and fragment override of approximately 3.5 cm. The humeral head is rotated medially due to retraction of the rotator cuff. The acromioclavicular joint is intact. Imaged lung parenchyma is clear. Aortic atherosclerosis noted. IMPRESSION: Displaced fracture proximal left humerus as described above. Electronically Signed   By: Drusilla Kannerhomas  Dalessio M.D.   On: 05/09/2017 12:12    Positive ROS: All other systems have been reviewed and were otherwise negative with the exception of those mentioned in the HPI  and as above.  Physical Exam: General: Alert, no acute distress Cardiovascular: No pedal edema Respiratory: No cyanosis, no use of accessory musculature GI: No organomegaly, abdomen is soft and non-tender Skin: No lesions in the area of chief complaint Neurologic: Sensation intact distally Lymphatic: No axillary or cervical lymphadenopathy  MUSCULOSKELETAL:  Left shoulder exam:  There is some erythema noted along the anterior shoulder girdle.  He  has some fullness there as well.  No appreciable ecchymosis at this time.  He is tender to palpation along the proximal shoulder.  He endorses sensation intact light touch throughout the left upper extremity.  He has no tenderness at the elbow hand or wrist.  He has a 2+ radial pulse.  Motor is intact distal.  Assessment: Left shoulder closed comminuted proximal humerus fracture.  Plan: -Tom Duarte is a very complicated medical history based on his poor pulmonary status and low functional status at baseline.  This is his nondominant extremity and I think should be suitable for closed management with no surgery.  He will need to be in the sling and nonweightbearing to the left upper extremity for at least 2 weeks.  We will see him in the office in 2 weeks for repeat x-rays and potentially progression of some gentle physical therapy.  I discussed my recommendations with the patient and answered all questions. -I will sign off at this time.    Yolonda Kida, MD Cell [phone]    05/09/2017 9:05 PM

## 2017-05-09 NOTE — ED Notes (Signed)
Bed: WA06 Expected date:  Expected time:  Means of arrival:  Comments: EMS-fall-shoulder pain

## 2017-05-09 NOTE — H&P (Signed)
History and Physical    Tom Duarte:[id number] DOB: 1928/05/29 DOA: 05/09/2017  PCP: Esperanza Richters, PA-C  Patient coming from:  home  Chief Complaint:   fall  HPI: Tom Duarte is a 83 y.o. male with medical history significant of copd, htn who lives in independent living and has been recommended to see a pulmonologist for his sob for a year but this appt keeps getting canceled for one reason or another.  Daughter is present and says he has been sob for many years.  He fell this am tripped and broke his right arm.  Pt incidentally found to be hypoxic on RA at 85% and referred for admission as home oxygen cannot be set up through the ED today.  No fevers.  No cough.  No chest pain.  No le edema or swelling.  Review of Systems: As per HPI otherwise 10 point review of systems negative per dtr  Past Medical History:  Diagnosis Date  . Anxiety   . Cataract   . Depression    Years ago not current per patient  . Emphysema of lung (HCC)   . Hypertension     Past Surgical History:  Procedure Laterality Date  . HERNIA REPAIR     rt side. year 2000.  Marland Kitchen HIP SURGERY     Joint replacement  . LEG SURGERY     Left     reports that he has been smoking.  he has never used smokeless tobacco. He reports that he drinks alcohol. His drug history is not on file.  No Known Allergies  Family History  Problem Relation Age of Onset  . GER disease Mother   . Heart disease Father     Prior to Admission medications   Medication Sig Start Date End Date Taking? Authorizing Provider  albuterol (VENTOLIN HFA) 108 (90 Base) MCG/ACT inhaler Inhale 2 puffs into the lungs every 6 (six) hours as needed for wheezing or shortness of breath. 08/29/16  Yes Saguier, Ramon Dredge, PA-C  aspirin 325 MG tablet Take 325 mg by mouth daily. 2 daily   Yes [provider]  budesonide-formoterol (SYMBICORT) 160-4.5 MCG/ACT inhaler Inhale 2 puffs into the lungs 2 (two) times daily. 08/29/16  Yes Saguier,  Ramon Dredge, PA-C  sertraline (ZOLOFT) 50 MG tablet 1 and half tab po q day Patient taking differently: Take 50 mg by mouth See admin instructions. 50 mg in morning  And 25 mg QHS 08/29/16  Yes Saguier, Ramon Dredge, PA-C  lisinopril (PRINIVIL,ZESTRIL) 10 MG tablet TAKE 1 TABLET (10 MG TOTAL) BY MOUTH DAILY. Patient not taking: Reported on 04/03/2017 05/19/16   Esperanza Richters, PA-C    Physical Exam: Vitals:   05/09/17 1125 05/09/17 1127 05/09/17 1406  BP: 138/78    Pulse: (!) 105  (!) 103  Resp:   20  Temp: 97.8 F (36.6 C)    TempSrc: Oral    SpO2: 95% 96% 97%      Constitutional: NAD, calm, comfortable Vitals:   05/09/17 1125 05/09/17 1127 05/09/17 1406  BP: 138/78    Pulse: (!) 105  (!) 103  Resp:   20  Temp: 97.8 F (36.6 C)    TempSrc: Oral    SpO2: 95% 96% 97%   Eyes: PERRL, lids and conjunctivae normal ENMT: Mucous membranes are moist. Posterior pharynx clear of any exudate or lesions.Normal dentition.  Neck: normal, supple, no masses, no thyromegaly Respiratory: clear to auscultation bilaterally, no wheezing, no crackles. Normal respiratory effort. No accessory muscle  use.  Cardiovascular: Regular rate and rhythm, no murmurs / rubs / gallops. No extremity edema. 2+ pedal pulses. No carotid bruits.  Abdomen: no tenderness, no masses palpated. No hepatosplenomegaly. Bowel sounds positive.  Musculoskeletal: no clubbing / cyanosis. No joint deformity upper and lower extremities. Good ROM, no contractures. Normal muscle tone.  Skin: no rashes, lesions, ulcers. No induration Neurologic: CN 2-12 grossly intact. Sensation intact, DTR normal. Strength 5/5 in all 4.  Psychiatric: Normal judgment and insight. Alert and oriented x 3. Normal mood.    Labs on Admission: I have personally reviewed following labs and imaging studies  CBC: Recent Labs  Lab 05/09/17 1200  WBC 15.7*  NEUTROABS 14.1*  HGB 13.5  HCT 41.3  MCV 88.6  PLT 196   Basic Metabolic Panel: Recent Labs  Lab  05/09/17 1200  NA 140  K 4.5  CL 106  CO2 24  GLUCOSE 118*  BUN 21*  CREATININE 1.13  CALCIUM 9.4   GFR: CrCl cannot be calculated (Unknown ideal weight.). Liver Function Tests: Recent Labs  Lab 05/09/17 1200  AST 34  ALT 19  ALKPHOS 82  BILITOT 0.4  PROT 7.5  ALBUMIN 4.2   No results for input(s): LIPASE, AMYLASE in the last 168 hours. No results for input(s): AMMONIA in the last 168 hours. Coagulation Profile: No results for input(s): INR, PROTIME in the last 168 hours. Cardiac Enzymes: No results for input(s): CKTOTAL, CKMB, CKMBINDEX, TROPONINI in the last 168 hours. BNP (last 3 results) No results for input(s): PROBNP in the last 8760 hours. HbA1C: No results for input(s): HGBA1C in the last 72 hours. CBG: No results for input(s): GLUCAP in the last 168 hours. Lipid Profile: No results for input(s): CHOL, HDL, LDLCALC, TRIG, CHOLHDL, LDLDIRECT in the last 72 hours. Thyroid Function Tests: No results for input(s): TSH, T4TOTAL, FREET4, T3FREE, THYROIDAB in the last 72 hours. Anemia Panel: No results for input(s): VITAMINB12, FOLATE, FERRITIN, TIBC, IRON, RETICCTPCT in the last 72 hours. Urine analysis:    Component Value Date/Time   COLORURINE AMBER (A) 01/29/2016 1704   APPEARANCEUR CLEAR 01/29/2016 1704   LABSPEC 1.024 01/29/2016 1704   PHURINE 5.5 01/29/2016 1704   GLUCOSEU NEGATIVE 01/29/2016 1704   HGBUR NEGATIVE 01/29/2016 1704   BILIRUBINUR SMALL (A) 01/29/2016 1704   KETONESUR NEGATIVE 01/29/2016 1704   PROTEINUR 30 (A) 01/29/2016 1704   NITRITE NEGATIVE 01/29/2016 1704   LEUKOCYTESUR NEGATIVE 01/29/2016 1704   Sepsis Labs: !!!!!!!!!!!!!!!!!!!!!!!!!!!!!!!!!!!!!!!!!!!! @LABRCNTIP (procalcitonin:4,lacticidven:4) )No results found for this or any previous visit (from the past 240 hour(s)).   Radiological Exams on Admission: Dg Chest 2 View  Result Date: 05/09/2017 CLINICAL DATA:  Fall this morning with left shoulder pain. EXAM: CHEST  2 VIEW  COMPARISON:  01/29/2016 FINDINGS: Lungs are adequately inflated with stable left basilar scarring. No consolidation, effusion or pneumothorax. Cardiomediastinal silhouette is within normal. Displaced left humeral neck fracture. IMPRESSION: No acute cardiopulmonary disease. Displaced left femoral neck fracture. Electronically Signed   By: Elberta Fortisaniel  Boyle M.D.   On: 05/09/2017 12:21   Ct Head Wo Contrast  Result Date: 05/09/2017 CLINICAL DATA:  The patient suffered a trip and fall on carpet today with a blow to the left forehead. Initial encounter. EXAM: CT HEAD WITHOUT CONTRAST CT CERVICAL SPINE WITHOUT CONTRAST TECHNIQUE: Multidetector CT imaging of the head and cervical spine was performed following the standard protocol without intravenous contrast. Multiplanar CT image reconstructions of the cervical spine were also generated. COMPARISON:  None. FINDINGS: CT HEAD FINDINGS Brain:  Cortical atrophy and chronic microvascular ischemic change are seen. There is no evidence of acute abnormality including hemorrhage, infarct, mass lesion, mass effect, midline shift or abnormal extra-axial fluid collection. No hydrocephalus or pneumocephalus. Vascular: Atherosclerosis noted. Skull: Intact. Sinuses/Orbits: Negative. Other: None. CT CERVICAL SPINE FINDINGS Alignment: Maintained. Skull base and vertebrae: No acute fracture. No primary bone lesion or focal pathologic process. Soft tissues and spinal canal: No prevertebral fluid or swelling. No visible canal hematoma. Disc levels: Multilevel loss of disc space height appears worst at C5-6 and C6-7. Scattered facet degenerative disease is also identified. Upper chest: Emphysematous disease is seen in the apices. Other: None. IMPRESSION: No acute abnormality head or cervical spine. Atrophy and chronic microvascular ischemic change. Atherosclerosis. Cervical spondylosis. Emphysema. Electronically Signed   By: Drusilla Kannerhomas  Dalessio M.D.   On: 05/09/2017 13:26   Ct Cervical Spine  Wo Contrast  Result Date: 05/09/2017 CLINICAL DATA:  The patient suffered a trip and fall on carpet today with a blow to the left forehead. Initial encounter. EXAM: CT HEAD WITHOUT CONTRAST CT CERVICAL SPINE WITHOUT CONTRAST TECHNIQUE: Multidetector CT imaging of the head and cervical spine was performed following the standard protocol without intravenous contrast. Multiplanar CT image reconstructions of the cervical spine were also generated. COMPARISON:  None. FINDINGS: CT HEAD FINDINGS Brain: Cortical atrophy and chronic microvascular ischemic change are seen. There is no evidence of acute abnormality including hemorrhage, infarct, mass lesion, mass effect, midline shift or abnormal extra-axial fluid collection. No hydrocephalus or pneumocephalus. Vascular: Atherosclerosis noted. Skull: Intact. Sinuses/Orbits: Negative. Other: None. CT CERVICAL SPINE FINDINGS Alignment: Maintained. Skull base and vertebrae: No acute fracture. No primary bone lesion or focal pathologic process. Soft tissues and spinal canal: No prevertebral fluid or swelling. No visible canal hematoma. Disc levels: Multilevel loss of disc space height appears worst at C5-6 and C6-7. Scattered facet degenerative disease is also identified. Upper chest: Emphysematous disease is seen in the apices. Other: None. IMPRESSION: No acute abnormality head or cervical spine. Atrophy and chronic microvascular ischemic change. Atherosclerosis. Cervical spondylosis. Emphysema. Electronically Signed   By: Drusilla Kannerhomas  Dalessio M.D.   On: 05/09/2017 13:26   Dg Shoulder Left  Result Date: 05/09/2017 CLINICAL DATA:  Left shoulder pain due to a fall this morning. EXAM: LEFT SHOULDER - 2+ VIEW COMPARISON:  None. FINDINGS: The patient has a fracture of the proximal left humerus. There is 1 shaft width anterior displacement and fragment override of approximately 3.5 cm. The humeral head is rotated medially due to retraction of the rotator cuff. The acromioclavicular  joint is intact. Imaged lung parenchyma is clear. Aortic atherosclerosis noted. IMPRESSION: Displaced fracture proximal left humerus as described above. Electronically Signed   By: Drusilla Kannerhomas  Dalessio M.D.   On: 05/09/2017 12:12      Assessment/Plan 83 yo male with likely chronic respiratory failure with hypoxia  Principal Problem:   Hypoxia- dr Pecola Leisurereese not comfortable with doing as outpt.  obs to set up home oxygen.  No CM today. cxr neg.  This sounds very chronic and has likely been present for some time.  Active Problems:   HTN (hypertension)   Other emphysema (HCC)   Chronic respiratory failure with hypoxia (HCC)   Humerus fracture      DVT prophylaxis:  scds Code Status:  full Family Communication: dtr Disposition Plan:  Per day team Consults called:  none Admission status:  obs   Najae,Deboraha Goar A MD [facility]  If 7PM-7AM, please contact night-coverage [email or web address] Password [email or web address]  05/09/2017, 3:05  PM

## 2017-05-09 NOTE — ED Notes (Signed)
Pt is aware a urine sample is needed, but is unable to provide one at this time. Urinal at bedside. 

## 2017-05-09 NOTE — ED Notes (Signed)
Pt dropped to 83% without oxygen just standing in room. Did not want to ambulate any further and wanted to rest. Pt also complained of right leg pain.

## 2017-05-10 ENCOUNTER — Observation Stay (HOSPITAL_COMMUNITY): Payer: Medicare Other

## 2017-05-10 DIAGNOSIS — F329 Major depressive disorder, single episode, unspecified: Secondary | ICD-10-CM | POA: Diagnosis present

## 2017-05-10 DIAGNOSIS — W010XXA Fall on same level from slipping, tripping and stumbling without subsequent striking against object, initial encounter: Secondary | ICD-10-CM | POA: Diagnosis present

## 2017-05-10 DIAGNOSIS — J9611 Chronic respiratory failure with hypoxia: Secondary | ICD-10-CM | POA: Diagnosis not present

## 2017-05-10 DIAGNOSIS — S42209A Unspecified fracture of upper end of unspecified humerus, initial encounter for closed fracture: Secondary | ICD-10-CM

## 2017-05-10 DIAGNOSIS — I10 Essential (primary) hypertension: Secondary | ICD-10-CM | POA: Diagnosis not present

## 2017-05-10 DIAGNOSIS — M8580 Other specified disorders of bone density and structure, unspecified site: Secondary | ICD-10-CM | POA: Diagnosis present

## 2017-05-10 DIAGNOSIS — M25562 Pain in left knee: Secondary | ICD-10-CM | POA: Diagnosis not present

## 2017-05-10 DIAGNOSIS — I739 Peripheral vascular disease, unspecified: Secondary | ICD-10-CM | POA: Diagnosis present

## 2017-05-10 DIAGNOSIS — F419 Anxiety disorder, unspecified: Secondary | ICD-10-CM | POA: Diagnosis present

## 2017-05-10 DIAGNOSIS — Z7951 Long term (current) use of inhaled steroids: Secondary | ICD-10-CM | POA: Diagnosis not present

## 2017-05-10 DIAGNOSIS — F17200 Nicotine dependence, unspecified, uncomplicated: Secondary | ICD-10-CM | POA: Diagnosis present

## 2017-05-10 DIAGNOSIS — R0902 Hypoxemia: Secondary | ICD-10-CM | POA: Diagnosis not present

## 2017-05-10 DIAGNOSIS — Z79899 Other long term (current) drug therapy: Secondary | ICD-10-CM | POA: Diagnosis not present

## 2017-05-10 DIAGNOSIS — J438 Other emphysema: Secondary | ICD-10-CM | POA: Diagnosis not present

## 2017-05-10 DIAGNOSIS — S72002A Fracture of unspecified part of neck of left femur, initial encounter for closed fracture: Secondary | ICD-10-CM | POA: Diagnosis present

## 2017-05-10 DIAGNOSIS — S8992XA Unspecified injury of left lower leg, initial encounter: Secondary | ICD-10-CM | POA: Diagnosis not present

## 2017-05-10 DIAGNOSIS — Z7982 Long term (current) use of aspirin: Secondary | ICD-10-CM | POA: Diagnosis not present

## 2017-05-10 NOTE — Evaluation (Signed)
Physical Therapy Evaluation Patient Details Name: Tom RossettiDavid S Willaims MRN: [id number] DOB: 07-May-1929 Today's Date: 05/10/2017   History of Present Illness  Tom RossettiDavid S Mcroberts is a 83 y.o. male with medical history significant of copd, htn who lives in independent living.  He fell  12/25and broke his left proximal humerus/nondisplaced. Treat with sling/nonsurgical. Pt incidentally found to be hypoxic on RA at 85% and referred for admission .  Clinical Impression  The  Patient requires 2 assist for bed mobility and stand pivot transfer to recliner. The patient complains of pain in the left hip area when mobilizing, bearing weight.Pt admitted with above diagnosis. Pt currently with functional limitations due to the deficits listed below (see PT Problem List).  Pt will benefit from skilled PT to increase their independence and safety with mobility to allow discharge to the venue listed below.   Oxygen saturation > 94% on RA, noted Dyspnea 3/4 and HR 124.    Follow Up Recommendations SNF- will need increased level of assist if [facility]    Equipment Recommendations  None recommended by PT    Recommendations for Other Services       Precautions / Restrictions Precautions Precautions: Fall Precaution Comments: monitor sats, HR Required Braces or Orthoses: Sling Restrictions Weight Bearing Restrictions: Yes LUE Weight Bearing: Non weight bearing      Mobility  Bed Mobility Overal bed mobility: Needs Assistance Bed Mobility: Sidelying to Sit;Supine to Sit   Sidelying to sit: Max assist;+2 for physical assistance;+2 for safety/equipment Supine to sit: +2 for physical assistance;+2 for safety/equipment;Max assist     General bed mobility comments: difficulty with  inability to use left UE to push to sitting.  Transfers Overall transfer level: Needs assistance Equipment used: Rolling walker (2 wheeled) Transfers: Sit to/from [employer] CorporationStand;Stand Pivot Transfers Sit to Stand:  Mod assist;+2 physical assistance;+2 safety/equipment Stand pivot transfers: Mod assist;+2 physical assistance;+2 safety/equipment       General transfer comment: assist to rise from bed with RUE on RW only. Small shuffle steps witrh assist to move the left foot to side step. patient reports left hip area pain.  Assist to sit down into recliner to control descent.  Ambulation/Gait                Stairs            Wheelchair Mobility    Modified Rankin (Stroke Patients Only)       Balance Overall balance assessment: Needs assistance;History of Falls Sitting-balance support: Feet supported;Single extremity supported Sitting balance-Leahy Scale: Poor     Standing balance support: During functional activity;Single extremity supported Standing balance-Leahy Scale: Poor                               Pertinent Vitals/Pain Pain Assessment: Faces Faces Pain Scale: Hurts whole lot Pain Location: left shoulder, reports left shoulder pain with weight bear, antalgic Pain Descriptors / Indicators: Discomfort;Grimacing;Guarding Pain Intervention(s): Repositioned;Monitored during session;Limited activity within patient's tolerance    Home Living                   Additional Comments: notes indicate  from independent living    Prior Function           Comments: unsure, patient id not offer info re: PLOF     Hand Dominance        Extremity/Trunk Assessment   Upper Extremity Assessment Upper Extremity Assessment: LUE  deficits/detail LUE Deficits / Details: in sling    Lower Extremity Assessment Lower Extremity Assessment: Generalized weakness    Cervical / Trunk Assessment Cervical / Trunk Assessment: Kyphotic  Communication   Communication: HOH  Cognition Arousal/Alertness: Awake/alert Behavior During Therapy: WFL for tasks assessed/performed Overall Cognitive Status: Difficult to assess                                  General Comments: oriented to hospital and reason. not to date      General Comments      Exercises     Assessment/Plan    PT Assessment Patient needs continued PT services  PT Problem List Decreased strength;Decreased mobility;Decreased balance;Decreased activity tolerance;Decreased range of motion;Decreased knowledge of precautions;Decreased safety awareness;Decreased knowledge of use of DME;Pain       PT Treatment Interventions Gait training;DME instruction;Functional mobility training;Therapeutic activities;Therapeutic exercise;Patient/family education    PT Goals (Current goals can be found in the Care Plan section)  Acute Rehab PT Goals Patient Stated Goal: agreed to sitting up PT Goal Formulation: With patient Time For Goal Achievement: 05/24/17 Potential to Achieve Goals: Fair    Frequency Min 2X/week   Barriers to discharge Decreased caregiver support      Co-evaluation               AM-PAC PT "6 Clicks" Daily Activity  Outcome Measure Difficulty turning over in bed (including adjusting bedclothes, sheets and blankets)?: Unable Difficulty moving from lying on back to sitting on the side of the bed? : Unable Difficulty sitting down on and standing up from a chair with arms (e.g., wheelchair, bedside commode, etc,.)?: Unable Help needed moving to and from a bed to chair (including a wheelchair)?: Total Help needed walking in hospital room?: Total Help needed climbing 3-5 steps with a railing? : Total 6 Click Score: 6    End of Session Equipment Utilized During Treatment: Gait belt Activity Tolerance: Patient limited by pain;Patient limited by fatigue Patient left: with chair alarm set;in chair Nurse Communication: Mobility status PT Visit Diagnosis: Difficulty in walking, not elsewhere classified (R26.2);History of falling (Z91.81)    Time: [id number] PT Time Calculation (min) (ACUTE ONLY): 18 min   Charges:   PT Evaluation $PT Eval Low  Complexity: 1 Low     PT G Codes:   PT G-Codes **NOT FOR INPATIENT CLASS** Functional Assessment Tool Used: Clinical judgement;AM-PAC 6 Clicks Basic Mobility Functional Limitation: Mobility: Walking and moving around Mobility: Walking and Moving Around Current Status (V4098(G8978): 100 percent impaired, limited or restricted Mobility: Walking and Moving Around Goal Status (J1914(G8979): At least 20 percent but less than 40 percent impaired, limited or restricted    [facility] DanvilleKaren Emylee Decelle PT [phone]   Rada HayHill, Marvyn Torrez Elizabeth 05/10/2017, 11:29 AM

## 2017-05-10 NOTE — Care Management Obs Status (Signed)
MEDICARE OBSERVATION STATUS NOTIFICATION   Patient Details  Name: Tom RossettiDavid S Duarte MRN: [id number] Date of Birth: [birth year]   Medicare Observation Status Notification Given:  Yes    Golda AcreDavis, Rhonda Lynn, RN 05/10/2017, 2:15 PM

## 2017-05-10 NOTE — Progress Notes (Signed)
PROGRESS NOTE  Tom RossettiDavid S Duarte ZOX:[id number] DOB: Nov 02, 1928 DOA: 05/09/2017 PCP: Esperanza RichtersSaguier, Edward, PA-C  HPI/Recap of past 24 hours: Tom RossettiDavid S Duarte is a 83 y.o. male with medical history significant of copd, htn who lives in independent living and has been recommended to see a pulmonologist for his dyspnea for a year but has not yet seen one. Tripped and fell which appeared to be a mechanical fall however on presentation to the ED hypoxic 85% on RA. Left proximal humerus fracture on xray. Orthopedic following with no plan for surgical intervention. Per ortho he will need to be in the sling and nonweightbearing to the left upper extremity for at least 2 weeks. Ortho will see him in the office in 2 weeks for repeat x-rays and potentially progression of some gentle physical therapy.  Seen and erxamined at his bedside. Denies pain when he does not move. Later, recived call from RN reporting that the patient had complained of left knee pain. Portable left knee xray ordered 05/10/17 revealed. 1. No acute bony abnormality. Diffuse osteopenia and degenerative change.  2. Peripheral vascular disease.    Assessment/Plan: Principal Problem:   Hypoxia Active Problems:   HTN (hypertension)   Other emphysema (HCC)   Chronic respiratory failure with hypoxia (HCC)   Humerus fracture   Acute on chronic Hypoxia Resolved O2 sat 94% RA on ambulation and 91% RA at rest Continue monitoring  Left proximal humerus fracture, poa Conservative treatment per ortho Arm on sling Follow up with ortho in 2 weeks  Left knee pain No fracture on xray done 05/10/17  Ambulatory dysfunction PT evaluated with recommendations for SNF  COPD Not in acute exacerbation Symbicort vientolin  Chronic depression Stable zoloft  HTN No acute issues Lisinopril at home    Code Status: Full  Family Communication: No family member at bedside  Disposition Plan: will stay another night to continue current  management. Possible placement to SNF. Will need increased level of assist per PT evaluation.   Consultants:  Orthopedic surgery  Procedures:  none  Antimicrobials:  none  DVT prophylaxis:  SCDs- lovenox sq 40 mg daily   Objective: Vitals:   05/09/17 2123 05/10/17 0350 05/10/17 0410 05/10/17 0801  BP: 111/68  137/79   Pulse: (!) 113  (!) 103   Resp: (!) 23  20   Temp: 99.4 F (37.4 C)  99.9 F (37.7 C)   TempSrc: Oral  Oral   SpO2: 96%  97% 91%  Weight:  53.4 kg (117 lb 11.6 oz)    Height:        Intake/Output Summary (Last 24 hours) at 05/10/2017 1421 Last data filed at 05/09/2017 1754 Gross per 24 hour  Intake 240 ml  Output -  Net 240 ml   Filed Weights   05/09/17 1649 05/10/17 0350  Weight: 52.8 kg (116 lb 6.5 oz) 53.4 kg (117 lb 11.6 oz)    Exam:  Eyes: PERRL, lids and conjunctivae normal ENMT: Mucous membranes are moist. Posterior pharynx clear of any exudate or lesions.Normal dentition.  Neck: normal, supple, no masses, no thyromegaly Respiratory: clear to auscultation bilaterally, no wheezing, no crackles. Normal respiratory effort. No accessory muscle use.  Cardiovascular: Regular rate and rhythm, no murmurs / rubs / gallops. No extremity edema. 2+ pedal pulses. No carotid bruits.  Abdomen: no tenderness, no masses palpated. No hepatosplenomegaly. Bowel sounds positive.  Musculoskeletal:  Left shoulder swelling and redness in a sling. Skin: as stated above with abrasions in upper extremities Neurologic:  non focal Psychiatric: Mood appropriate for condition and setting  Data Reviewed: CBC: Recent Labs  Lab 05/09/17 1200  WBC 15.7*  NEUTROABS 14.1*  HGB 13.5  HCT 41.3  MCV 88.6  PLT 196   Basic Metabolic Panel: Recent Labs  Lab 05/09/17 1200  NA 140  K 4.5  CL 106  CO2 24  GLUCOSE 118*  BUN 21*  CREATININE 1.13  CALCIUM 9.4   GFR: Estimated Creatinine Clearance: 35.4 mL/min (by C-G formula based on SCr of 1.13 mg/dL). Liver  Function Tests: Recent Labs  Lab 05/09/17 1200  AST 34  ALT 19  ALKPHOS 82  BILITOT 0.4  PROT 7.5  ALBUMIN 4.2   No results for input(s): LIPASE, AMYLASE in the last 168 hours. No results for input(s): AMMONIA in the last 168 hours. Coagulation Profile: No results for input(s): INR, PROTIME in the last 168 hours. Cardiac Enzymes: No results for input(s): CKTOTAL, CKMB, CKMBINDEX, TROPONINI in the last 168 hours. BNP (last 3 results) No results for input(s): PROBNP in the last 8760 hours. HbA1C: No results for input(s): HGBA1C in the last 72 hours. CBG: No results for input(s): GLUCAP in the last 168 hours. Lipid Profile: No results for input(s): CHOL, HDL, LDLCALC, TRIG, CHOLHDL, LDLDIRECT in the last 72 hours. Thyroid Function Tests: No results for input(s): TSH, T4TOTAL, FREET4, T3FREE, THYROIDAB in the last 72 hours. Anemia Panel: No results for input(s): VITAMINB12, FOLATE, FERRITIN, TIBC, IRON, RETICCTPCT in the last 72 hours. Urine analysis:    Component Value Date/Time   COLORURINE YELLOW 05/09/2017 1530   APPEARANCEUR HAZY (A) 05/09/2017 1530   LABSPEC 1.023 05/09/2017 1530   PHURINE 5.0 05/09/2017 1530   GLUCOSEU NEGATIVE 05/09/2017 1530   HGBUR SMALL (A) 05/09/2017 1530   BILIRUBINUR NEGATIVE 05/09/2017 1530   KETONESUR 5 (A) 05/09/2017 1530   PROTEINUR 30 (A) 05/09/2017 1530   NITRITE NEGATIVE 05/09/2017 1530   LEUKOCYTESUR NEGATIVE 05/09/2017 1530   Sepsis Labs: @LABRCNTIP (procalcitonin:4,lacticidven:4)  )No results found for this or any previous visit (from the past 240 hour(s)).    Studies: Dg Knee Left Port  Result Date: 05/10/2017 CLINICAL DATA:  Fall.  Pain. EXAM: PORTABLE LEFT KNEE - 1-2 VIEW COMPARISON:  No recent. FINDINGS: Diffuse osteopenia. No acute bony abnormality identified. No evidence of fracture. No evidence of dislocation . Peripheral vascular calcification. IMPRESSION: 1. No acute bony abnormality. Diffuse osteopenia and  degenerative change. 2. Peripheral vascular disease . Electronically Signed   By: Maisie Fushomas  Register   On: 05/10/2017 12:55    Scheduled Meds: . aspirin  325 mg Oral Daily  . ipratropium-albuterol  3 mL Nebulization TID  . mouth rinse  15 mL Mouth Rinse BID  . sodium chloride flush  3 mL Intravenous Q12H    Continuous Infusions: . sodium chloride       LOS: 0 days     Darlin Droparole N Orian Figueira, MD [facility] Pager [phone]  If 7PM-7AM, please contact night-coverage [email or web address] Password [email or web address] 05/10/2017, 2:21 PM

## 2017-05-10 NOTE — Progress Notes (Addendum)
SATURATION QUALIFICATIONS: (This note is used to comply with regulatory documentation for home oxygen)  Patient Saturations on Room Air at Rest = 91%  Patient Saturations on Room Air while Ambulating = 94%- only able to pivot to chair, HR 124  Patient Saturations on  Liters of oxygen while Ambulating = %- patient unable to ambulate  Please briefly explain why patient needs home oxygen: Blanchard KelchKaren Kelvin Sennett PT [phone]

## 2017-05-10 NOTE — Progress Notes (Signed)
No new change from prior nurse's assessment. Will continue to monitor patient.

## 2017-05-10 NOTE — Progress Notes (Addendum)
Date: May 10, 2017 Tom SmilingRhonda Deeann Duarte, BSN, [place]3, [place] [phone] Chart and notes review for patient progress and needs. Will follow for case management and discharge needs. [facility]2 [facility] Next review date: [id number]

## 2017-05-11 LAB — CBC
HCT: 32.5 % — ABNORMAL LOW (ref 39.0–52.0)
Hemoglobin: 10.7 g/dL — ABNORMAL LOW (ref 13.0–17.0)
MCH: 29.2 pg (ref 26.0–34.0)
MCHC: 32.9 g/dL (ref 30.0–36.0)
MCV: 88.6 fL (ref 78.0–100.0)
Platelets: 157 10*3/uL (ref 150–400)
RBC: 3.67 MIL/uL — ABNORMAL LOW (ref 4.22–5.81)
RDW: 14.3 % (ref 11.5–15.5)
WBC: 9.6 10*3/uL (ref 4.0–10.5)

## 2017-05-11 LAB — BASIC METABOLIC PANEL
Anion gap: 10 (ref 5–15)
BUN: 32 mg/dL — AB (ref 6–20)
CALCIUM: 8.9 mg/dL (ref 8.9–10.3)
CHLORIDE: 102 mmol/L (ref 101–111)
CO2: 24 mmol/L (ref 22–32)
CREATININE: 1.16 mg/dL (ref 0.61–1.24)
GFR calc non Af Amer: 55 mL/min — ABNORMAL LOW (ref >60)
Glucose, Bld: 117 mg/dL — ABNORMAL HIGH (ref 65–99)
Potassium: 4.2 mmol/L (ref 3.5–5.1)
SODIUM: 136 mmol/L (ref 135–145)

## 2017-05-11 MED ORDER — SERTRALINE HCL 50 MG PO TABS
50.0000 mg | ORAL_TABLET | Freq: Every day | ORAL | Status: DC
  Administered 2017-05-11: 50 mg via ORAL
  Filled 2017-05-11: qty 1

## 2017-05-11 MED ORDER — SERTRALINE HCL 25 MG PO TABS: 25.0000 mg | ORAL_TABLET | Freq: Every day | ORAL | Status: DC

## 2017-05-11 MED ORDER — KETOROLAC TROMETHAMINE 15 MG/ML IJ SOLN
15.0000 mg | Freq: Once | INTRAMUSCULAR | Status: AC
  Administered 2017-05-11: 15 mg via INTRAVENOUS
  Filled 2017-05-11: qty 1

## 2017-05-11 NOTE — Discharge Summary (Addendum)
Discharge Summary  Tom RossettiDavid Duarte Duarte ZOX:[id number] DOB: [birth year]  PCP: Tom Duarte  Admit date: 05/09/2017 Discharge date: 05/11/2017  Time spent: 25 minutes   Recommendations for Outpatient Follow-up:  1. Follow up with your PCP within a week 2. Fall precaution 3. Take your medications as prescribed  Discharge Diagnoses:  Active Hospital Problems   Diagnosis Date Noted  . Hypoxia 05/09/2017  . Chronic respiratory failure with hypoxia (HCC) 05/09/2017  . Humerus fracture 05/09/2017  . HTN (hypertension) 06/05/2014  . Other emphysema (HCC) 06/05/2014    Resolved Hospital Problems  No resolved problems to display.    Discharge Condition: stable  Diet recommendation: resume previous diet  Vitals:   05/11/17 0500 05/11/17 1403  BP: (!) 144/76 117/67  Pulse: (!) 108 96  Resp: 20 20  Temp: 98 F (36.7 C) 98.1 F (36.7 C)  SpO2: 90% 92%    History of present illness:  Tom Duarte a 83 y.o.malewith medical history significant ofcopd, htn who lives in independent living and has been recommended to see a pulmonologist for his dyspnea for a year but has not yet seen one. Tripped and fell which appeared to be a mechanical fall however on presentation to the ED hypoxic 85% on RA. Left proximal humerus fracture on xray. Orthopedic following with no plan for surgical intervention. Per ortho he will need to be in the sling and nonweightbearing to the left upper extremity for at least 2 weeks. Ortho will see him in the office in 2 weeks for repeat x-rays and potentially progression of some gentle physical therapy.  Denies pain when he does not move. Complained of left knee pain. Portable left knee xray ordered 05/10/17 revealed. 1. No acute bony abnormality. Diffuse osteopenia and degenerative change.  2. Peripheral vascular disease.  On the day of discharge the patient was hemodynamically stable. He will need to follow up with orthopedic surgery in 2 weeks  and with his PCP within a week.    Hospital Course:  Principal Problem:   Hypoxia Active Problems:   HTN (hypertension)   Other emphysema (HCC)   Chronic respiratory failure with hypoxia (HCC)   Humerus fracture  Acute on chronic Hypoxia Resolved O2 sat 94% RA on ambulation and 91% RA at rest Continue monitoring  Left proximal humerus fracture, poa Conservative treatment per ortho Arm on sling Follow up with ortho in 2 weeks  Left knee pain No fracture on xray done 05/10/17  Ambulatory dysfunction PT evaluated with recommendations for SNF Patient does not qualify for SNF  COPD Not in acute exacerbation  Symbicort vientolin  Chronic depression Stable zoloft  HTN No acute issues Lisinopril at home   Procedures:  None  Consultations:  Orthopedic surgery  Discharge Exam: BP 117/67 (BP Location: Right Arm)   Pulse 96   Temp 98.1 F (36.7 C) (Oral)   Resp 20   Ht 5\' 8"  (1.727 m)   Wt 55.2 kg (121 lb 11.1 oz)   SpO2 92%   BMI 18.50 kg/m   Eyes:PERRL, lids and conjunctivae normal ENMT:Mucous membranes are moist. Posterior pharynx clear of any exudate or lesions.Normal dentition.  Neck:normal, supple, no masses, no thyromegaly Respiratory:clear to auscultation bilaterally, no wheezing, no crackles. Normal respiratory effort. No accessory muscle use.  Cardiovascular:Regular rate and rhythm, no murmurs / rubs / gallops. No extremity edema. 2+ pedal pulses. No carotid bruits.  Abdomen:no tenderness, no masses palpated. No hepatosplenomegaly. Bowel sounds positive.  Musculoskeletal: Left shoulder swelling and redness in  a sling. Skin:as stated above with abrasions in upper extremities Neurologic:non focal Psychiatric:Mood appropriate for condition and setting   Discharge Instructions You were cared for by a hospitalist during your hospital stay. If you have any questions about your discharge medications or the care you received while  you were in the hospital after you are discharged, you can call the unit and asked to speak with the hospitalist on call if the hospitalist that took care of you is not available. Once you are discharged, your primary care physician will handle any further medical issues. Please note that NO REFILLS for any discharge medications will be authorized once you are discharged, as it is imperative that you return to your primary care physician (or establish a relationship with a primary care physician if you do not have one) for your aftercare needs so that they can reassess your need for medications and monitor your lab values.   Allergies as of 05/11/2017   No Known Allergies     Medication List    STOP taking these medications   lisinopril 10 MG tablet Commonly known as:  PRINIVIL,ZESTRIL     TAKE these medications   albuterol 108 (90 Base) MCG/ACT inhaler Commonly known as:  VENTOLIN HFA Inhale 2 puffs into the lungs every 6 (six) hours as needed for wheezing or shortness of breath.   aspirin 325 MG tablet Take 325 mg by mouth daily. 2 daily   budesonide-formoterol 160-4.5 MCG/ACT inhaler Commonly known as:  SYMBICORT Inhale 2 puffs into the lungs 2 (two) times daily.   sertraline 50 MG tablet Commonly known as:  ZOLOFT 1 and half tab po q day What changed:    how much to take  how to take this  when to take this  additional instructions      No Known Allergies Follow-up Information    Tom Duarte Follow up.   Specialties:  Internal Medicine, Family Medicine Contact information: [address] [place] [address] [phone]        Tom Duarte Follow up.   Specialty:  Orthopedic Surgery Contact information: [address] [place] [address] [place] [address] [phone]            The results of significant diagnostics from this hospitalization (including imaging, microbiology, ancillary and laboratory) are listed  below for reference.    Significant Diagnostic Studies: Dg Chest 2 View  Result Date: 05/09/2017 CLINICAL DATA:  Fall this morning with left shoulder pain. EXAM: CHEST  2 VIEW COMPARISON:  01/29/2016 FINDINGS: Lungs are adequately inflated with stable left basilar scarring. No consolidation, effusion or pneumothorax. Cardiomediastinal silhouette is within normal. Displaced left humeral neck fracture. IMPRESSION: No acute cardiopulmonary disease. Displaced left femoral neck fracture. Electronically Signed   By: Elberta Fortis M.D.   On: 05/09/2017 12:21   Ct Head Wo Contrast  Result Date: 05/09/2017 CLINICAL DATA:  The patient suffered a trip and fall on carpet today with a blow to the left forehead. Initial encounter. EXAM: CT HEAD WITHOUT CONTRAST CT CERVICAL SPINE WITHOUT CONTRAST TECHNIQUE: Multidetector CT imaging of the head and cervical spine was performed following the standard protocol without intravenous contrast. Multiplanar CT image reconstructions of the cervical spine were also generated. COMPARISON:  None. FINDINGS: CT HEAD FINDINGS Brain: Cortical atrophy and chronic microvascular ischemic change are seen. There is no evidence of acute abnormality including hemorrhage, infarct, mass lesion, mass effect, midline shift or abnormal extra-axial fluid collection. No hydrocephalus  or pneumocephalus. Vascular: Atherosclerosis noted. Skull: Intact. Sinuses/Orbits: Negative. Other: None. CT CERVICAL SPINE FINDINGS Alignment: Maintained. Skull base and vertebrae: No acute fracture. No primary bone lesion or focal pathologic process. Soft tissues and spinal canal: No prevertebral fluid or swelling. No visible canal hematoma. Disc levels: Multilevel loss of disc space height appears worst at C5-6 and C6-7. Scattered facet degenerative disease is also identified. Upper chest: Emphysematous disease is seen in the apices. Other: None. IMPRESSION: No acute abnormality head or cervical spine. Atrophy and  chronic microvascular ischemic change. Atherosclerosis. Cervical spondylosis. Emphysema. Electronically Signed   By: Drusilla Kanner M.D.   On: 05/09/2017 13:26   Ct Cervical Spine Wo Contrast  Result Date: 05/09/2017 CLINICAL DATA:  The patient suffered a trip and fall on carpet today with a blow to the left forehead. Initial encounter. EXAM: CT HEAD WITHOUT CONTRAST CT CERVICAL SPINE WITHOUT CONTRAST TECHNIQUE: Multidetector CT imaging of the head and cervical spine was performed following the standard protocol without intravenous contrast. Multiplanar CT image reconstructions of the cervical spine were also generated. COMPARISON:  None. FINDINGS: CT HEAD FINDINGS Brain: Cortical atrophy and chronic microvascular ischemic change are seen. There is no evidence of acute abnormality including hemorrhage, infarct, mass lesion, mass effect, midline shift or abnormal extra-axial fluid collection. No hydrocephalus or pneumocephalus. Vascular: Atherosclerosis noted. Skull: Intact. Sinuses/Orbits: Negative. Other: None. CT CERVICAL SPINE FINDINGS Alignment: Maintained. Skull base and vertebrae: No acute fracture. No primary bone lesion or focal pathologic process. Soft tissues and spinal canal: No prevertebral fluid or swelling. No visible canal hematoma. Disc levels: Multilevel loss of disc space height appears worst at C5-6 and C6-7. Scattered facet degenerative disease is also identified. Upper chest: Emphysematous disease is seen in the apices. Other: None. IMPRESSION: No acute abnormality head or cervical spine. Atrophy and chronic microvascular ischemic change. Atherosclerosis. Cervical spondylosis. Emphysema. Electronically Signed   By: Drusilla Kanner M.D.   On: 05/09/2017 13:26   Dg Shoulder Left  Result Date: 05/09/2017 CLINICAL DATA:  Left shoulder pain due to a fall this morning. EXAM: LEFT SHOULDER - 2+ VIEW COMPARISON:  None. FINDINGS: The patient has a fracture of the proximal left humerus. There  is 1 shaft width anterior displacement and fragment override of approximately 3.5 cm. The humeral head is rotated medially due to retraction of the rotator cuff. The acromioclavicular joint is intact. Imaged lung parenchyma is clear. Aortic atherosclerosis noted. IMPRESSION: Displaced fracture proximal left humerus as described above. Electronically Signed   By: Drusilla Kanner M.D.   On: 05/09/2017 12:12   Dg Knee Left Port  Result Date: 05/10/2017 CLINICAL DATA:  Fall.  Pain. EXAM: PORTABLE LEFT KNEE - 1-2 VIEW COMPARISON:  No recent. FINDINGS: Diffuse osteopenia. No acute bony abnormality identified. No evidence of fracture. No evidence of dislocation . Peripheral vascular calcification. IMPRESSION: 1. No acute bony abnormality. Diffuse osteopenia and degenerative change. 2. Peripheral vascular disease . Electronically Signed   By: Maisie Fus  Register   On: 05/10/2017 12:55    Microbiology: No results found for this or any previous visit (from the past 240 hour(Duarte)).   Labs: Basic Metabolic Panel: Recent Labs  Lab 05/09/17 1200 05/11/17 0507  NA 140 136  K 4.5 4.2  CL 106 102  CO2 24 24  GLUCOSE 118* 117*  BUN 21* 32*  CREATININE 1.13 1.16  CALCIUM 9.4 8.9   Liver Function Tests: Recent Labs  Lab 05/09/17 1200  AST 34  ALT 19  ALKPHOS 82  BILITOT 0.4  PROT 7.5  ALBUMIN 4.2   No results for input(Duarte): LIPASE, AMYLASE in the last 168 hours. No results for input(Duarte): AMMONIA in the last 168 hours. CBC: Recent Labs  Lab 05/09/17 1200 05/11/17 0507  WBC 15.7* 9.6  NEUTROABS 14.1*  --   HGB 13.5 10.7*  HCT 41.3 32.5*  MCV 88.6 88.6  PLT 196 157   Cardiac Enzymes: No results for input(Duarte): CKTOTAL, CKMB, CKMBINDEX, TROPONINI in the last 168 hours. BNP: BNP (last 3 results) No results for input(Duarte): BNP in the last 8760 hours.  ProBNP (last 3 results) No results for input(Duarte): PROBNP in the last 8760 hours.  CBG: No results for input(Duarte): GLUCAP in the last 168  hours.     Signed:  Darlin Droparole N Briasia Flinders, Duarte [facility] 05/11/2017, 4:46 PM

## 2017-05-11 NOTE — Discharge Instructions (Signed)
Musculoskeletal Pain  Musculoskeletal pain is muscle and bone aches and pains. This pain can occur in any part of the body.  Follow these instructions at home:  · Only take medicines for pain, discomfort, or fever as told by your health care provider.  · You may continue all activities unless the activities cause more pain. When the pain lessens, slowly resume normal activities. Gradually increase the intensity and duration of the activities or exercise.  · During periods of severe pain, bed rest may be helpful. Lie or sit in any position that is comfortable, but get out of bed and walk around at least every several hours.  · If directed, put ice on the injured area.  ? Put ice in a plastic bag.  ? Place a towel between your skin and the bag.  ? Leave the ice on for 20 minutes, 2-3 times a day.  Contact a health care provider if:  · Your pain is getting worse.  · Your pain is not relieved with medicines.  · You lose function in the area of the pain if the pain is in your arms, legs, or neck.  This information is not intended to replace advice given to you by your health care provider. Make sure you discuss any questions you have with your health care provider.  Document Released: 05/02/2005 Document Revised: 10/13/2015 Document Reviewed: 01/04/2013  Elsevier Interactive Patient Education © 2017 Elsevier Inc.

## 2017-05-12 ENCOUNTER — Telehealth: Payer: Self-pay

## 2017-05-12 NOTE — Telephone Encounter (Signed)
[facility] follow up call made to patient. Left message for return call. [facility] has been scheduled with E. Saguier 05/17/17.

## 2017-05-13 ENCOUNTER — Emergency Department (HOSPITAL_COMMUNITY)
Admission: EM | Admit: 2017-05-13 | Discharge: 2017-05-13 | Disposition: A | Discharge status: Home / Self Care | Payer: Medicare Other | Attending: Emergency Medicine | Admitting: Emergency Medicine

## 2017-05-13 ENCOUNTER — Emergency Department (HOSPITAL_COMMUNITY): Payer: Medicare Other

## 2017-05-13 ENCOUNTER — Other Ambulatory Visit: Payer: Self-pay

## 2017-05-13 ENCOUNTER — Encounter (HOSPITAL_COMMUNITY): Payer: Self-pay | Admitting: *Deleted

## 2017-05-13 DIAGNOSIS — W19XXXA Unspecified fall, initial encounter: Secondary | ICD-10-CM | POA: Insufficient documentation

## 2017-05-13 DIAGNOSIS — S40022A Contusion of left upper arm, initial encounter: Secondary | ICD-10-CM | POA: Diagnosis not present

## 2017-05-13 DIAGNOSIS — Y939 Activity, unspecified: Secondary | ICD-10-CM | POA: Diagnosis not present

## 2017-05-13 DIAGNOSIS — T148XXA Other injury of unspecified body region, initial encounter: Secondary | ICD-10-CM | POA: Insufficient documentation

## 2017-05-13 DIAGNOSIS — S42292S Other displaced fracture of upper end of left humerus, sequela: Secondary | ICD-10-CM | POA: Diagnosis present

## 2017-05-13 DIAGNOSIS — Z96649 Presence of unspecified artificial hip joint: Secondary | ICD-10-CM | POA: Diagnosis not present

## 2017-05-13 DIAGNOSIS — Y999 Unspecified external cause status: Secondary | ICD-10-CM | POA: Diagnosis not present

## 2017-05-13 DIAGNOSIS — Y929 Unspecified place or not applicable: Secondary | ICD-10-CM | POA: Insufficient documentation

## 2017-05-13 DIAGNOSIS — F17200 Nicotine dependence, unspecified, uncomplicated: Secondary | ICD-10-CM | POA: Insufficient documentation

## 2017-05-13 DIAGNOSIS — M79603 Pain in arm, unspecified: Secondary | ICD-10-CM | POA: Diagnosis not present

## 2017-05-13 DIAGNOSIS — Z7982 Long term (current) use of aspirin: Secondary | ICD-10-CM | POA: Insufficient documentation

## 2017-05-13 DIAGNOSIS — M25552 Pain in left hip: Secondary | ICD-10-CM | POA: Diagnosis not present

## 2017-05-13 DIAGNOSIS — I10 Essential (primary) hypertension: Secondary | ICD-10-CM | POA: Insufficient documentation

## 2017-05-13 DIAGNOSIS — M25522 Pain in left elbow: Secondary | ICD-10-CM | POA: Diagnosis not present

## 2017-05-13 DIAGNOSIS — Z79899 Other long term (current) drug therapy: Secondary | ICD-10-CM | POA: Insufficient documentation

## 2017-05-13 DIAGNOSIS — R52 Pain, unspecified: Secondary | ICD-10-CM

## 2017-05-13 DIAGNOSIS — S42292A Other displaced fracture of upper end of left humerus, initial encounter for closed fracture: Secondary | ICD-10-CM | POA: Diagnosis not present

## 2017-05-13 DIAGNOSIS — S59902A Unspecified injury of left elbow, initial encounter: Secondary | ICD-10-CM | POA: Diagnosis not present

## 2017-05-13 DIAGNOSIS — S79912A Unspecified injury of left hip, initial encounter: Secondary | ICD-10-CM | POA: Diagnosis not present

## 2017-05-13 NOTE — ED Triage Notes (Addendum)
Pt arrived by [facility] from family members house. [facility] with left arm fracture. They were unable to operate and family reports increase in pain and swelling to his arm.  significant bruising and swelling noted to arm, +radial pulse.

## 2017-05-13 NOTE — Discharge Instructions (Signed)
Keep arm wrapped and change dressing as needed.  Continue sleeping in a semi-elevated position.  Do hand exercises as described.  To follow-up with orthopedics.  Return here as needed for any worsening symptoms.

## 2017-05-13 NOTE — ED Provider Notes (Signed)
[facility] Provider Note   CSN: [id number] Arrival date & time: 05/13/17  1516     History   Chief Complaint Chief Complaint  Patient presents with  . Arm Pain    HPI Tom RossettiDavid S Duarte is a 83 y.o. male.  Patient is an 83 year old male who presents with arm swelling.  He had a fall on December 25 resulting in a left proximal humerus fracture.  He has been staying with his daughter who states that his arm has been increasingly swollen and is now oozing.  He has ongoing pain to his shoulder but denies any other recent falls or injuries.  He is not on anticoagulants.  He denies any numbness or weakness in the hand.  They state that he has been sleeping in a recliner.      Past Medical History:  Diagnosis Date  . Anxiety   . Cataract   . Depression    Years ago not current per patient  . Emphysema of lung (HCC)   . Hypertension     Patient Active Problem List   Diagnosis Date Noted  . Hypoxia 05/09/2017  . Chronic respiratory failure with hypoxia (HCC) 05/09/2017  . Humerus fracture 05/09/2017  . FTT (failure to thrive) in adult 01/29/2016  . HTN (hypertension) 06/05/2014  . Other emphysema (HCC) 06/05/2014    Past Surgical History:  Procedure Laterality Date  . HERNIA REPAIR     rt side. year 2000.  Marland Kitchen. HIP SURGERY     Joint replacement  . LEG SURGERY     Left       Home Medications    Prior to Admission medications   Medication Sig Start Date End Date Taking? Authorizing Provider  albuterol (VENTOLIN HFA) 108 (90 Base) MCG/ACT inhaler Inhale 2 puffs into the lungs every 6 (six) hours as needed for wheezing or shortness of breath. 08/29/16   Saguier, Ramon DredgeEdward, PA-C  aspirin 325 MG tablet Take 325 mg by mouth daily. 2 daily    [provider]  budesonide-formoterol (SYMBICORT) 160-4.5 MCG/ACT inhaler Inhale 2 puffs into the lungs 2 (two) times daily. 08/29/16   Saguier, Ramon DredgeEdward, PA-C  sertraline (ZOLOFT) 50 MG tablet  1 and half tab po q day Patient taking differently: Take 50 mg by mouth See admin instructions. 50 mg in morning  And 25 mg QHS 08/29/16   Saguier, Ramon DredgeEdward, PA-C    Family History Family History  Problem Relation Age of Onset  . GER disease Mother   . Heart disease Father     Social History Social History   Tobacco Use  . Smoking status: Current Every Day Smoker  . Smokeless tobacco: Never Used  Substance Use Topics  . Alcohol use: Yes    Alcohol/week: 0.0 oz  . Drug use: Not on file     Allergies   Patient has no known allergies.   Review of Systems Review of Systems  Constitutional: Negative for fever.  Gastrointestinal: Negative for nausea and vomiting.  Musculoskeletal: Positive for arthralgias and joint swelling. Negative for back pain and neck pain.  Skin: Positive for wound.  Neurological: Negative for weakness, numbness and headaches.     Physical Exam Updated Vital Signs BP (!) 139/58 (BP Location: Right Arm)   Pulse 94   Temp 97.7 F (36.5 C) (Oral)   Resp 18   SpO2 94%   Physical Exam  Constitutional: He is oriented to person, place, and time. He appears well-developed and well-nourished.  HENT:  Head: Normocephalic and atraumatic.  Neck: Normal range of motion. Neck supple.  Cardiovascular: Normal rate.  Pulmonary/Chest: Effort normal.  Musculoskeletal: He exhibits edema and tenderness.  Patient has tenderness to the left shoulder.  He has moderate swelling of the arm from the mid humerus down to the mid forearm.  There is some mild tenderness around the elbow.  There is a large amount of ecchymosis to the arm.  The compartments are soft.  He has full range of motion of the hand and forearm without increased pain.  He has normal sensation to light touch in all nerve distributions of the hand.  Radial pulses are intact.  There is some tiny skin tears to the arm which are oozing serosanguineous fluid.  Neurological: He is alert and oriented to person,  place, and time.  Skin: Skin is warm and dry.  Psychiatric: He has a normal mood and affect.     ED Treatments / Results  Labs (all labs ordered are listed, but only abnormal results are displayed) Labs Reviewed - No data to display  EKG  EKG Interpretation None       Radiology Dg Elbow 2 Views Left  Result Date: 05/13/2017 CLINICAL DATA:  Recent fall.  Pain EXAM: LEFT ELBOW - 2 VIEW COMPARISON:  None. FINDINGS: There is no evidence of fracture, dislocation, or joint effusion. There is no evidence of arthropathy or other focal bone abnormality. Soft tissues are unremarkable. IMPRESSION: Negative. Electronically Signed   By: Marlan Palau M.D.   On: 05/13/2017 20:08   Dg Shoulder Left  Result Date: 05/13/2017 CLINICAL DATA:  Shoulder pain EXAM: LEFT SHOULDER - 2+ VIEW COMPARISON:  05/09/2017 FINDINGS: Displaced fracture left humeral neck with rotation of the humeral head similar to the prior study. Avulsion of the greater tuberosity unchanged. One shaft width of anterior displacement of the humeral shaft compared to the humeral head unchanged from the prior study. No other fracture. IMPRESSION: Comminuted displaced fracture left humeral head and neck unchanged from 05/09/2017 Electronically Signed   By: Marlan Palau M.D.   On: 05/13/2017 20:08   Dg Hip Unilat W Or Wo Pelvis 2-3 Views Left  Result Date: 05/13/2017 CLINICAL DATA:  Fall today with left hip pain, initial encounter EXAM: DG HIP (WITH OR WITHOUT PELVIS) 2-3V LEFT COMPARISON:  None. FINDINGS: Left hip prosthesis is noted and well seated within the acetabulum. The pelvic ring appears intact. Degenerative changes of the lumbar spine and right hip joint are noted. Vascular calcifications are seen. No acute soft tissue abnormality is seen. IMPRESSION: Postoperative change without acute abnormality. Electronically Signed   By: Alcide Clever M.D.   On: 05/13/2017 21:39    Procedures Procedures (including critical care  time)  Medications Ordered in ED Medications - No data to display   Initial Impression / Assessment and Plan / ED Course  I have reviewed the triage vital signs and the nursing notes.  Pertinent labs & imaging results that were available during my care of the patient were reviewed by me and considered in my medical decision making (see chart for details).     Patient is a 82 year old male who has a recently diagnosed proximal humerus fracture after a fall.  He came in today for increased arm swelling.  He has large area of hematoma to his extremity.  There is no signs of compartment syndrome.  He has some weeping related to the swelling.  X-rays reveal no new fractures.  He was complaining  of some pain in his hip as well and x-rays do not reveal any acute abnormalities.  He is status post hip replacement.  He was discharged home in good condition.  His weeping wounds were wrapped.  I spoke with Dr. Aundria Rudogers with orthopedics who gave some symptomatic care recommendations which I relayed to the patient's family.  He was given a new shoulder sling.  He has a follow-up appointment to follow-up with orthopedics.  Return precautions were given.  Final Clinical Impressions(s) / ED Diagnoses   Final diagnoses:  Other closed displaced fracture of proximal end of left humerus, sequela  Hematoma    ED Discharge Orders    None       Rolan BuccoBelfi, Kayann Maj, MD 05/13/17 2259

## 2017-05-15 ENCOUNTER — Telehealth: Payer: Self-pay | Admitting: Medical

## 2017-05-15 NOTE — Telephone Encounter (Signed)
Ct of chest placed in November. Would you coordinate with radiology on getting that study done.

## 2017-05-17 ENCOUNTER — Encounter: Payer: Self-pay | Admitting: Medical

## 2017-05-17 ENCOUNTER — Telehealth: Payer: Self-pay | Admitting: Medical

## 2017-05-17 ENCOUNTER — Ambulatory Visit (INDEPENDENT_AMBULATORY_CARE_PROVIDER_SITE_OTHER): Payer: Medicare Other | Admitting: Medical

## 2017-05-17 VITALS — BP 98/54 | HR 80 | Resp 16

## 2017-05-17 DIAGNOSIS — M25562 Pain in left knee: Secondary | ICD-10-CM | POA: Diagnosis not present

## 2017-05-17 DIAGNOSIS — S42292D Other displaced fracture of upper end of left humerus, subsequent encounter for fracture with routine healing: Secondary | ICD-10-CM | POA: Diagnosis not present

## 2017-05-17 DIAGNOSIS — D649 Anemia, unspecified: Secondary | ICD-10-CM | POA: Diagnosis not present

## 2017-05-17 DIAGNOSIS — R0902 Hypoxemia: Secondary | ICD-10-CM

## 2017-05-17 DIAGNOSIS — R5383 Other fatigue: Secondary | ICD-10-CM

## 2017-05-17 DIAGNOSIS — R627 Adult failure to thrive: Secondary | ICD-10-CM

## 2017-05-17 NOTE — Telephone Encounter (Signed)
[facility]  [facility]   [phone]  [phone]     Called   With  Information  Pertinent to  Patient  Ov  Today  At    130 PM  .   It  Is   Hollies    Opinion    That  The  Patient needs   24  /7  Skilled  Care   As  Opposed  To  Home  Health .  Hollie  Has  Already  Spoken  To   Social  Worker   IT trainerAlice Ward  About  The  Case.  Social  Workers . Phone  Number  Is   [phone] .   Hollie states  May  Call  Her  Or the  Social  Worker  With  Any  [facility]. Hollie  States  Patients  son and  Daughter  Are coming  With her to  Appointment . Appointment is  With  First Data CorporationEdward  Saguier  According to  [facility]

## 2017-05-17 NOTE — Patient Instructions (Addendum)
Recent fatigue may be from general status, age and recovering from the fracture.  But do want to repeat CBC and metabolic panel.  Particularly check anemia level and potassium level.  I am going to try to coordinate referral skilled nursing facility with social worker.  I will try to call her later trying to get information on various facilities as well as financial assistance information.  We will see the progress made regarding referral to skilled nursing.  If that is delayed then try to arrange PT/OT outpatient and try to get [employer] or other  to come out send him up with oxygen.  Please keep orthopedist appointment.  Follow-up date to be determined depending on  getting into skilled nursing facility.  follow-up as needed.  Hopefully we will have an idea of this Friday.

## 2017-05-17 NOTE — Progress Notes (Signed)
Subjective:    Patient ID: Tom Duarte, male    DOB: [birth year], 84 y.o.   MRN: [id number]  HPI  Pt had tripped and fell at home. He went to ED hypoxic with humueus fracture. He was admitted but decision made not to do surgery. Pt admitted on 05-09-2017 and discharge on 05-11-2017.  He tripped at entrance of his house.  He went back to ED for edema and tenderness of the arm. His hematoma was evaluated. It was determined/thought he had no compartment syndrome.  He was given new sling in ED. Pt has follow up with orthopedist.  Home health, PT and social worker think he needs skilled nursing. Cost is a concern. Social worker is currently working on [employer].  Pt daughter and brother do visit him.  Now he afraid that he will fall again.   Pt had some anemia on last blood check.  Pt is at his daughter house living with his x wife.  Pt did not meet criteria to be direct admit to skilled nursing. He needed to stay 3 nights.  Pt has appointment with orthopedist this Friday.   Review of Systems  Constitutional: Positive for fatigue. Negative for chills and fever.  Respiratory: Negative for cough, chest tightness, shortness of breath and wheezing.   Cardiovascular: Negative for chest pain and palpitations.  Gastrointestinal: Negative for abdominal pain, anal bleeding, blood in stool and diarrhea.  Musculoskeletal:       Arm pain.  Neurological: Negative for dizziness, seizures, weakness, numbness and headaches.  Hematological: Negative for adenopathy. Does not bruise/bleed easily.  Psychiatric/Behavioral: Negative for behavioral problems, confusion, dysphoric mood and hallucinations.    Past Medical History:  Diagnosis Date  . Anxiety   . Cataract   . Depression    Years ago not current per patient  . Emphysema of lung (HCC)   . Hypertension      Social History   Socioeconomic History  . Marital status: Divorced    Spouse name: Not on file    . Number of children: Not on file  . Years of education: Not on file  . Highest education level: Not on file  Social Needs  . Financial resource strain: Not on file  . Food insecurity - worry: Not on file  . Food insecurity - inability: Not on file  . Transportation needs - medical: Not on file  . Transportation needs - non-medical: Not on file  Occupational History  . Not on file  Tobacco Use  . Smoking status: Current Every Day Smoker  . Smokeless tobacco: Never Used  Substance and Sexual Activity  . Alcohol use: Yes    Alcohol/week: 0.0 oz  . Drug use: Not on file  . Sexual activity: No  Other Topics Concern  . Not on file  Social History Narrative  . Not on file    Past Surgical History:  Procedure Laterality Date  . HERNIA REPAIR     rt side. year 2000.  Marland Kitchen HIP SURGERY     Joint replacement  . LEG SURGERY     Left    Family History  Problem Relation Age of Onset  . GER disease Mother   . Heart disease Father     No Known Allergies  Current Outpatient Medications on File Prior to Visit  Medication Sig Dispense Refill  . albuterol (VENTOLIN HFA) 108 (90 Base) MCG/ACT inhaler Inhale 2 puffs into the lungs every 6 (six) hours as needed for  wheezing or shortness of breath. 18 g 3  . aspirin 325 MG tablet Take 325 mg by mouth daily. 2 daily    . budesonide-formoterol (SYMBICORT) 160-4.5 MCG/ACT inhaler Inhale 2 puffs into the lungs 2 (two) times daily. 1 Inhaler 5  . sertraline (ZOLOFT) 50 MG tablet 1 and half tab po q day (Patient taking differently: Take 50 mg by mouth See admin instructions. 50 mg in morning  And 25 mg QHS) 45 tablet 5   No current facility-administered medications on file prior to visit.     BP (!) 98/54   Pulse 80   Resp 16   SpO2 100%       Objective:   Physical Exam  General Mental Status- Alert. General Appearance- Not in acute distress.   Skin General: Color- Normal Color. Moisture- Normal Moisture.  Neck Carotid  Arteries- Normal color. Moisture- Normal Moisture. No carotid bruits. No JVD.  Chest and Lung Exam Auscultation: Breath Sounds:-Normal.  Cardiovascular Auscultation:Rythm- Regular. Murmurs & Other Heart Sounds:Auscultation of the heart reveals- No Murmurs.  Abdomen Inspection:-Inspeection Normal. Palpation/Percussion:Note:No mass. Palpation and Percussion of the abdomen reveal- Non Tender, Non Distended + BS, no rebound or guarding.    Neurologic Cranial Nerve exam:- CN III-XII intact(No nystagmus), symmetric smile. Drift Test:- No drift. Romberg Exam:- Negative.  Heal to Toe Gait exam:-Normal. Finger to Nose:- Normal/Intact Strength:- 5/5 equal and symmetric strength both upper and lower extremities.  Left arm-he has bruising from his lateral humerus area down to his elbow.  But there is no redness, swelling or warmth.  Underneath some co-band on his left wrist.  Small abrasion with scab covering.  No serosanguineous discharge presently or purulent discharge.  Radial and ulnar pulse intact and he has good capillary refill.  Good grip strength.      Assessment & Plan:  Recent fatigue may be from general status, age and recovering from the fracture.  But do want to repeat CBC and metabolic panel.  Particularly check anemia level and potassium level.  I am going to try to coordinate referral skilled nursing facility with social worker.  I will try to call her later trying to get information on various facilities as well as financial assistance information.  We will see the progress made regarding referral to skilled nursing.  If that is delayed then try to arrange PT/OT outpatient and try to get [facility](or other to come out send him up with oxygen.  Please keep orthopedist appointment.  Follow-up date to be determined depending on  getting into skilled nursing facility.  follow-up as needed.   Hopefully we will have an idea of this Friday.  Quadry Kampa, Ramon DredgeEdward, PA-C   Assisted  Living maybe better since he did not stay 3 nights in the hospital so does not qualify for skilled nursing. I talked with Child psychotherapistsocial worker.

## 2017-05-17 NOTE — Telephone Encounter (Signed)
I did talk with RN that went  to patient's house.  RN, physical therapist and social worker think that he needs skilled nursing facility as opposed to home health.  Patient is weak per RN.  Family is doing.  What they can to watch their dad.  The social worker Fulton Molelice is going to try the get them information on some financial assistance since has only Medicare prior RN and there would be some out-of-pocket cost for him to be placed in ill facility.  Allises phone number is [phone].

## 2017-05-18 ENCOUNTER — Telehealth: Payer: Self-pay | Admitting: Medical

## 2017-05-18 DIAGNOSIS — R944 Abnormal results of kidney function studies: Secondary | ICD-10-CM

## 2017-05-18 DIAGNOSIS — D649 Anemia, unspecified: Secondary | ICD-10-CM

## 2017-05-18 LAB — COMPREHENSIVE METABOLIC PANEL
ALBUMIN: 3.8 g/dL (ref 3.5–5.2)
ALT: 11 U/L (ref 0–53)
AST: 15 U/L (ref 0–37)
Alkaline Phosphatase: 61 U/L (ref 39–117)
BUN: 67 mg/dL — AB (ref 6–23)
CHLORIDE: 104 meq/L (ref 96–112)
CO2: 27 meq/L (ref 19–32)
CREATININE: 2.02 mg/dL — AB (ref 0.40–1.50)
Calcium: 9.2 mg/dL (ref 8.4–10.5)
GFR: 33.44 mL/min — ABNORMAL LOW (ref >60.00)
Glucose, Bld: 100 mg/dL — ABNORMAL HIGH (ref 70–99)
Potassium: 4.9 mEq/L (ref 3.5–5.1)
SODIUM: 144 meq/L (ref 135–145)
Total Bilirubin: 0.7 mg/dL (ref 0.2–1.2)
Total Protein: 6.8 g/dL (ref 6.0–8.3)

## 2017-05-18 LAB — CBC WITH DIFFERENTIAL/PLATELET
BASOS ABS: 0.1 10*3/uL (ref 0.0–0.1)
BASOS PCT: 1.3 % (ref 0.0–3.0)
EOS ABS: 0.4 10*3/uL (ref 0.0–0.7)
Eosinophils Relative: 4 % (ref 0.0–5.0)
HCT: 33.5 % — ABNORMAL LOW (ref 39.0–52.0)
HEMOGLOBIN: 10.8 g/dL — AB (ref 13.0–17.0)
Lymphocytes Relative: 7.9 % — ABNORMAL LOW (ref 12.0–46.0)
Lymphs Abs: 0.7 10*3/uL (ref 0.7–4.0)
MCHC: 32.3 g/dL (ref 30.0–36.0)
MCV: 91.2 fl (ref 78.0–100.0)
MONO ABS: 0.7 10*3/uL (ref 0.1–1.0)
Monocytes Relative: 7.7 % (ref 3.0–12.0)
Neutro Abs: 7 10*3/uL (ref 1.4–7.7)
Neutrophils Relative %: 79.1 % — ABNORMAL HIGH (ref 43.0–77.0)
Platelets: 396 10*3/uL (ref 150.0–400.0)
RBC: 3.68 Mil/uL — AB (ref 4.22–5.81)
RDW: 15.4 % (ref 11.5–15.5)
WBC: 8.9 10*3/uL (ref 4.0–10.5)

## 2017-05-18 LAB — IRON: IRON: 42 ug/dL (ref 42–165)

## 2017-05-18 NOTE — Telephone Encounter (Signed)
Future order CMP and CBC placed

## 2017-05-19 DIAGNOSIS — S42295D Other nondisplaced fracture of upper end of left humerus, subsequent encounter for fracture with routine healing: Secondary | ICD-10-CM | POA: Diagnosis not present

## 2017-05-22 ENCOUNTER — Telehealth: Payer: Self-pay | Admitting: Medical

## 2017-05-22 NOTE — Telephone Encounter (Signed)
Copied from CRM [id number]. Topic: General - Other >> May 22, 2017  1:10 PM Lelon FrohlichGolden, Tashia, [place]MA wrote: Reason for CRM: Pt needs form for admittance into a health care facility  please call pt son at [phone]

## 2017-05-24 NOTE — Telephone Encounter (Signed)
Spoke with pt's son pt has appointment tomorrow.

## 2017-05-25 ENCOUNTER — Telehealth: Payer: Self-pay | Admitting: Medical

## 2017-05-25 ENCOUNTER — Other Ambulatory Visit: Payer: Medicare Other

## 2017-05-25 ENCOUNTER — Encounter: Payer: Self-pay | Admitting: Medical

## 2017-05-25 ENCOUNTER — Emergency Department (HOSPITAL_BASED_OUTPATIENT_CLINIC_OR_DEPARTMENT_OTHER): Payer: Medicare Other

## 2017-05-25 ENCOUNTER — Ambulatory Visit (INDEPENDENT_AMBULATORY_CARE_PROVIDER_SITE_OTHER): Payer: Medicare Other | Admitting: Medical

## 2017-05-25 ENCOUNTER — Emergency Department (HOSPITAL_BASED_OUTPATIENT_CLINIC_OR_DEPARTMENT_OTHER)
Admission: EM | Admit: 2017-05-25 | Discharge: 2017-05-25 | Disposition: A | Discharge status: Home / Self Care | Payer: Medicare Other | Attending: Emergency Medicine | Admitting: Emergency Medicine

## 2017-05-25 ENCOUNTER — Encounter (HOSPITAL_BASED_OUTPATIENT_CLINIC_OR_DEPARTMENT_OTHER): Payer: Self-pay | Admitting: *Deleted

## 2017-05-25 ENCOUNTER — Other Ambulatory Visit: Payer: Self-pay

## 2017-05-25 VITALS — BP 90/50 | HR 83 | Resp 16

## 2017-05-25 DIAGNOSIS — I959 Hypotension, unspecified: Secondary | ICD-10-CM

## 2017-05-25 DIAGNOSIS — R5383 Other fatigue: Secondary | ICD-10-CM | POA: Diagnosis not present

## 2017-05-25 DIAGNOSIS — R0902 Hypoxemia: Secondary | ICD-10-CM | POA: Diagnosis not present

## 2017-05-25 DIAGNOSIS — J984 Other disorders of lung: Secondary | ICD-10-CM | POA: Diagnosis not present

## 2017-05-25 DIAGNOSIS — F17200 Nicotine dependence, unspecified, uncomplicated: Secondary | ICD-10-CM | POA: Insufficient documentation

## 2017-05-25 DIAGNOSIS — R627 Adult failure to thrive: Secondary | ICD-10-CM | POA: Insufficient documentation

## 2017-05-25 DIAGNOSIS — I10 Essential (primary) hypertension: Secondary | ICD-10-CM | POA: Insufficient documentation

## 2017-05-25 DIAGNOSIS — Z79899 Other long term (current) drug therapy: Secondary | ICD-10-CM | POA: Diagnosis not present

## 2017-05-25 DIAGNOSIS — R4020 Unspecified coma: Secondary | ICD-10-CM | POA: Diagnosis not present

## 2017-05-25 DIAGNOSIS — R531 Weakness: Secondary | ICD-10-CM | POA: Diagnosis not present

## 2017-05-25 DIAGNOSIS — F0390 Unspecified dementia without behavioral disturbance: Secondary | ICD-10-CM | POA: Diagnosis not present

## 2017-05-25 DIAGNOSIS — R6251 Failure to thrive (child): Secondary | ICD-10-CM

## 2017-05-25 DIAGNOSIS — R262 Difficulty in walking, not elsewhere classified: Secondary | ICD-10-CM | POA: Diagnosis not present

## 2017-05-25 LAB — CBC WITH DIFFERENTIAL/PLATELET
BASOS PCT: 1 %
Basophils Absolute: 0.1 10*3/uL (ref 0.0–0.1)
EOS ABS: 0.3 10*3/uL (ref 0.0–0.7)
Eosinophils Relative: 3 %
HCT: 35.4 % — ABNORMAL LOW (ref 39.0–52.0)
Hemoglobin: 11.3 g/dL — ABNORMAL LOW (ref 13.0–17.0)
LYMPHS ABS: 1.1 10*3/uL (ref 0.7–4.0)
Lymphocytes Relative: 11 %
MCH: 29.4 pg (ref 26.0–34.0)
MCHC: 31.9 g/dL (ref 30.0–36.0)
MCV: 92.2 fL (ref 78.0–100.0)
MONO ABS: 0.7 10*3/uL (ref 0.1–1.0)
MONOS PCT: 7 %
NEUTROS PCT: 78 %
Neutro Abs: 8 10*3/uL — ABNORMAL HIGH (ref 1.7–7.7)
Platelets: 541 10*3/uL — ABNORMAL HIGH (ref 150–400)
RBC: 3.84 MIL/uL — ABNORMAL LOW (ref 4.22–5.81)
RDW: 14.6 % (ref 11.5–15.5)
WBC: 10.2 10*3/uL (ref 4.0–10.5)

## 2017-05-25 LAB — COMPREHENSIVE METABOLIC PANEL
ALT: 13 U/L — AB (ref 17–63)
AST: 24 U/L (ref 15–41)
Albumin: 3.7 g/dL (ref 3.5–5.0)
Alkaline Phosphatase: 97 U/L (ref 38–126)
Anion gap: 11 (ref 5–15)
BILIRUBIN TOTAL: 0.8 mg/dL (ref 0.3–1.2)
BUN: 26 mg/dL — AB (ref 6–20)
CALCIUM: 9.1 mg/dL (ref 8.9–10.3)
CO2: 26 mmol/L (ref 22–32)
CREATININE: 1.1 mg/dL (ref 0.61–1.24)
Chloride: 105 mmol/L (ref 101–111)
GFR calc Af Amer: >60 mL/min (ref >60)
GFR calc non Af Amer: 59 mL/min — ABNORMAL LOW (ref >60)
Glucose, Bld: 103 mg/dL — ABNORMAL HIGH (ref 65–99)
Potassium: 4.1 mmol/L (ref 3.5–5.1)
SODIUM: 142 mmol/L (ref 135–145)
TOTAL PROTEIN: 7.2 g/dL (ref 6.5–8.1)

## 2017-05-25 LAB — URINALYSIS, ROUTINE W REFLEX MICROSCOPIC
BILIRUBIN URINE: NEGATIVE
Glucose, UA: NEGATIVE mg/dL
HGB URINE DIPSTICK: NEGATIVE
KETONES UR: NEGATIVE mg/dL
Leukocytes, UA: NEGATIVE
NITRITE: NEGATIVE
PH: 6 (ref 5.0–8.0)
Protein, ur: NEGATIVE mg/dL
SPECIFIC GRAVITY, URINE: 1.02 (ref 1.005–1.030)

## 2017-05-25 LAB — LIPASE, BLOOD: Lipase: 32 U/L (ref 11–51)

## 2017-05-25 MED ORDER — SODIUM CHLORIDE 0.9 % IV SOLN
INTRAVENOUS | Status: DC
  Administered 2017-05-25: 19:00:00 via INTRAVENOUS

## 2017-05-25 MED ORDER — SODIUM CHLORIDE 0.9 % IV BOLUS (SEPSIS)
500.0000 mL | Freq: Once | INTRAVENOUS | Status: AC
  Administered 2017-05-25: 500 mL via INTRAVENOUS

## 2017-05-25 NOTE — Progress Notes (Signed)
Subjective:    Patient ID: Tom Duarte, male    DOB: [birth year], 84 y.o.   MRN: [id number]  HPI  Pt in for follow up.  Social worker has given him info on financial assistance. Pt only stayed 2 days in hospital for his humerus fracture. He was discharged home.   Assisted living won't accept him. Since he can't walk/transfer.(Family did investigate this since last visit.)  Pt can't walk recently since last dc. He seems scared since falling and fracturing his humerus.   Pt has some problems with urinating. He might have urge to urinate excessivley. His depends has been wet and he getting a rash. He seems uncomfortable. Rash around his groin area.  Daughter having difficult time getting him up. So he sits in wet depends.  Since the fall he is not able to stand well by himself.   He has severe fatigue, copd and hypoxia.  Pt denies feeling dizzy.   Recent humerus fracture.(Not reporting any arm pain.)  He has been sleeping a lot and dozing off all day.     Review of Systems  Constitutional: Positive for fatigue. Negative for chills and fever.       Seems tired and just sits around dozing off intermittently.  HENT: Negative for congestion, ear pain, facial swelling, nosebleeds, postnasal drip and rhinorrhea.   Respiratory: Negative for cough, chest tightness, shortness of breath and wheezing.   Cardiovascular: Negative for chest pain and palpitations.  Gastrointestinal: Negative for abdominal pain, blood in stool and constipation.  Genitourinary: Negative for decreased urine volume, dysuria, flank pain, frequency and testicular pain.       Initially described may be difficulty urinating and daughter thought he was in pain but on further discussion it appears he can urinate but has a groin rash that is itching and possibly painful.  Musculoskeletal: Negative for back pain, myalgias, neck pain and neck stiffness.  Skin: Negative for rash.  Neurological: Negative for dizziness,  syncope, weakness, numbness and headaches.       Unable to ambulate.  Hematological: Negative for adenopathy. Does not bruise/bleed easily.  Psychiatric/Behavioral: Negative for behavioral problems, confusion and suicidal ideas. The patient is not nervous/anxious.        But daughter thinks she is some depressed.  He might be afraid to stand as well per family.  Ever since a fall and been reluctant to stand or ambulate.    Past Medical History:  Diagnosis Date  . Anxiety   . Cataract   . Depression    Years ago not current per patient  . Emphysema of lung (HCC)   . Hypertension      Social History   Socioeconomic History  . Marital status: Divorced    Spouse name: Not on file  . Number of children: Not on file  . Years of education: Not on file  . Highest education level: Not on file  Social Needs  . Financial resource strain: Not on file  . Food insecurity - worry: Not on file  . Food insecurity - inability: Not on file  . Transportation needs - medical: Not on file  . Transportation needs - non-medical: Not on file  Occupational History  . Not on file  Tobacco Use  . Smoking status: Current Every Day Smoker  . Smokeless tobacco: Never Used  Substance and Sexual Activity  . Alcohol use: Yes    Alcohol/week: 0.0 oz  . Drug use: Not on file  . Sexual activity:  No  Other Topics Concern  . Not on file  Social History Narrative  . Not on file    Past Surgical History:  Procedure Laterality Date  . HERNIA REPAIR     rt side. year 2000.  Marland Kitchen HIP SURGERY     Joint replacement  . LEG SURGERY     Left    Family History  Problem Relation Age of Onset  . GER disease Mother   . Heart disease Father     No Known Allergies  Current Outpatient Medications on File Prior to Visit  Medication Sig Dispense Refill  . albuterol (VENTOLIN HFA) 108 (90 Base) MCG/ACT inhaler Inhale 2 puffs into the lungs every 6 (six) hours as needed for wheezing or shortness of breath. 18  g 3  . aspirin 325 MG tablet Take 325 mg by mouth daily. 2 daily    . budesonide-formoterol (SYMBICORT) 160-4.5 MCG/ACT inhaler Inhale 2 puffs into the lungs 2 (two) times daily. 1 Inhaler 5  . sertraline (ZOLOFT) 50 MG tablet 1 and half tab po q day (Patient taking differently: Take 50 mg by mouth See admin instructions. 50 mg in morning  And 25 mg QHS) 45 tablet 5   No current facility-administered medications on file prior to visit.     BP (!) 90/50   Pulse 83   Resp 16   SpO2 100%      Objective:   Physical Exam   General- No acute distress. Dozes off during the exam. Thin pt.not interacting much today. Neck- Full range of motion, no jvd Lungs- Clear, even and unlabored. Heart- regular rate and rhythm. Neurologic- CNII- XII grossly intact. Abdomen soft, nontender, nondistended,  Skin- red inflamed.area to both groins.  Upper arms feel dry.    Assessment & Plan:   With hypotension, fatigue, unable to ambulate, hypoxia with activity, COPD and decreased GFR.  I do think it would be best to be evaluated in the emergency department.  All of these conditions, might meet criteria for admission.  It is apparent that you have extreme difficulty taking him at home with and he does not qualify for assisted living.  If he is seen at the hospital and not eventually discharged to nursing hom then we can fill out the FL-2 to form.  You would need to investigate which nursing home you would like for him go to and  get the form.  This route may take time but go through that process if needed.  I apologize that home health referral PT,OT and [facility] did not go through.  Follow-up with me post ED evaluation  or on hospital follow-up.  In the event he is admitted to try to get a sense discharge plans.  Feel will be transferred to nursing home then please bring me the FL2  Form.  I did call downstairs and talk with the emergency department MD and inform him of patient's recent  presentation.  Obbie Lewallen, Ramon Dredge, PA-C

## 2017-05-25 NOTE — ED Triage Notes (Signed)
Pt was seen by his MD today and had a BP of 90/. He was brought to the ED for further evaluation. He is sleepy at triage but family states this is unchanged over the past several weeks since he fell and broke his left humerus.

## 2017-05-25 NOTE — Telephone Encounter (Signed)
I would point out also when I go to snapshot and scroll all the way down I can see my complete note without going through the addending process.  May be this would be helpful for epic person to know.

## 2017-05-25 NOTE — Patient Instructions (Addendum)
With hypotension, fatigue, unable to ambulate, hypoxia with activity, COPD and decreased GFR.  I do think it would be best to be evaluated in the emergency department.  All of these conditions, might meet criteria for admission.  It is apparent that you have extreme difficulty taking him at home with and he does not qualify for assisted living.  If he is seen at the hospital and not eventually discharged to nursing hom then we can fill out the FL-2 to form.  You would need to investigate which nursing home you would like for him go to and  get the form.  This route may take time but go through that process if needed.  I apologize that home health referral PT,OT and [facility] did not go through.  Follow-up with me post ED evaluation  or on hospital follow-up.  In the event he is admitted to try to get a sense discharge plans.  Feel will be transferred to nursing home then please bring me the FL2  form.

## 2017-05-25 NOTE — ED Notes (Signed)
Pt in xray when rounding 

## 2017-05-25 NOTE — ED Notes (Signed)
Patient contact information for social work to the reach patient:  1) Tom Duarte = [phone] Or 2) Tom Duarte = [phone]

## 2017-05-25 NOTE — ED Provider Notes (Signed)
[facility] EMERGENCY DEPARTMENT Provider Note   CSN: [id number] Arrival date & time: 05/25/17  1624     History   Chief Complaint Chief Complaint  Patient presents with  . Hypotension    HPI Tom Duarte is a 84 y.o. male.  Level 5 caveat pertains.  Patient with history of dementia not very verbal.  History obtained through family members.  Patient with a fall on Christmas day.  Resulting in a fracture to his left humerus.  Patient admitted on that day following the fall.  Patient was determined to be a nonoperative candidate and was discharged 2 days later.  Prior to this fall patient was living in his own senior apartment.  But since the fall patient has been unable to take care of himself.  Not eating very well.  Not walking well.  Patient had an appoint with his primary care doctor today for some follow-up labs and also family was accompanying him with wanting to discuss with primary care doctor how they go about formal nursing home placement.  Patient has a past history of depression.  Patient has a history of emphysema.  Patient does not have a formal history of dementia but clinically it seems to be present.  When he saw his primary care doctor they get a systolic blood pressure of 90 so he was referred down to the emergency department for further evaluation.  Patient is currently staying with his daughter.  She does not have adequate facilities for him so he sleeps on the couch.  Patient has been sleepy now for several weeks family says no real change in that.      Past Medical History:  Diagnosis Date  . Anxiety   . Cataract   . Depression    Years ago not current per patient  . Emphysema of lung (HCC)   . Hypertension     Patient Active Problem List   Diagnosis Date Noted  . Hypoxia 05/09/2017  . Chronic respiratory failure with hypoxia (HCC) 05/09/2017  . Humerus fracture 05/09/2017  . FTT (failure to thrive) in adult 01/29/2016  . HTN (hypertension)  06/05/2014  . Other emphysema (HCC) 06/05/2014    Past Surgical History:  Procedure Laterality Date  . HERNIA REPAIR     rt side. year 2000.  Marland Kitchen. HIP SURGERY     Joint replacement  . LEG SURGERY     Left       Home Medications    Prior to Admission medications   Medication Sig Start Date End Date Taking? Authorizing Provider  albuterol (VENTOLIN HFA) 108 (90 Base) MCG/ACT inhaler Inhale 2 puffs into the lungs every 6 (six) hours as needed for wheezing or shortness of breath. 08/29/16   Saguier, Ramon DredgeEdward, PA-C  aspirin 325 MG tablet Take 325 mg by mouth daily. 2 daily    [provider]  budesonide-formoterol (SYMBICORT) 160-4.5 MCG/ACT inhaler Inhale 2 puffs into the lungs 2 (two) times daily. 08/29/16   Saguier, Ramon DredgeEdward, PA-C  sertraline (ZOLOFT) 50 MG tablet 1 and half tab po q day Patient taking differently: Take 50 mg by mouth See admin instructions. 50 mg in morning  And 25 mg QHS 08/29/16   Saguier, Ramon DredgeEdward, PA-C    Family History Family History  Problem Relation Age of Onset  . GER disease Mother   . Heart disease Father     Social History Social History   Tobacco Use  . Smoking status: Current Every Day Smoker  .  Smokeless tobacco: Never Used  Substance Use Topics  . Alcohol use: Yes    Alcohol/week: 0.0 oz  . Drug use: Not on file     Allergies   Patient has no known allergies.   Review of Systems Review of Systems  Unable to perform ROS: Dementia     Physical Exam Updated Vital Signs BP (!) 145/78 (BP Location: Right Arm)   Pulse 68   Temp 98.9 F (37.2 C) (Rectal)   Resp 20   Ht 1.727 m (5\' 8" )   Wt 54.9 kg (121 lb)   SpO2 98%   BMI 18.40 kg/m   Physical Exam  Constitutional: He appears well-developed and well-nourished. No distress.  Patient is very thin.  HENT:  Head: Normocephalic and atraumatic.  Mucous membranes dry  Eyes: Conjunctivae and EOM are normal. Pupils are equal, round, and reactive to light.  Neck: Neck supple.    Cardiovascular: Normal rate and regular rhythm.  Pulmonary/Chest: Effort normal and breath sounds normal. No respiratory distress.  Abdominal: Soft. Bowel sounds are normal. There is no tenderness.  Musculoskeletal: Normal range of motion. He exhibits no edema.  Marked bruising to the left arm consistent where the humerus fracture is.  Radial pulse intact distally.  Patient will use the fingers of the left arm.  No evidence of any dislocation.  Patient is extremities with multiple areas of some bruising and abrasions.  No active bleeding.  Neurological: He is alert. No cranial nerve deficit. He exhibits normal muscle tone. Coordination normal.  Skin: Skin is warm.  Nursing note and vitals reviewed.    ED Treatments / Results  Labs (all labs ordered are listed, but only abnormal results are displayed) Labs Reviewed  COMPREHENSIVE METABOLIC PANEL - Abnormal; Notable for the following components:      Result Value   Glucose, Bld 103 (*)    BUN 26 (*)    ALT 13 (*)    GFR calc non Af Amer 59 (*)    All other components within normal limits  CBC WITH DIFFERENTIAL/PLATELET - Abnormal; Notable for the following components:   RBC 3.84 (*)    Hemoglobin 11.3 (*)    HCT 35.4 (*)    Platelets 541 (*)    Neutro Abs 8.0 (*)    All other components within normal limits  LIPASE, BLOOD  URINALYSIS, ROUTINE W REFLEX MICROSCOPIC    EKG  EKG Interpretation None       Radiology Dg Chest 2 View  Result Date: 05/25/2017 CLINICAL DATA:  Hypertension and fatigue. EXAM: CHEST  2 VIEW COMPARISON:  05/09/2017 FINDINGS: Mildly enlarged cardiac silhouette. Calcific atherosclerotic disease and tortuosity of the aorta. Stable lower lobe predominant emphysematous changes with architectural distortion and left pleural/subpleural scarring. Possible small left pleural effusion. Stable fracture of the left humerus. IMPRESSION: Stable emphysematous changes and chronic pleural/parenchymal scarring with  nodular subpleural density in the left lower lobe. Possible small left pleural effusion. Electronically Signed   By: Ted Mcalpine M.D.   On: 05/25/2017 19:26   Ct Head Wo Contrast  Result Date: 05/25/2017 CLINICAL DATA:  84 year old male with altered level of consciousness. EXAM: CT HEAD WITHOUT CONTRAST TECHNIQUE: Contiguous axial images were obtained from the base of the skull through the vertex without intravenous contrast. COMPARISON:  05/09/2017 FINDINGS: BRAIN: There is moderate stable sulcal and ventricular prominence consistent with superficial and central atrophy. No intraparenchymal hemorrhage, mass effect nor midline shift. Mild-to-moderate periventricular and subcortical white matter hypodensities consistent with chronic  small vessel ischemic disease are identified. No acute large vascular territory infarcts. No abnormal extra-axial fluid collections. Chronic small basal ganglial lacunar infarct on the right. Basal cisterns are not effaced and midline. VASCULAR: Moderate calcific atherosclerosis of the carotid siphons. SKULL: No skull fracture. No significant scalp soft tissue swelling. SINUSES/ORBITS: The mastoid air-cells are clear. The included paranasal sinuses are well-aerated.The included ocular globes and orbital contents are non-suspicious. OTHER: None IMPRESSION: Chronic stable cerebral atrophy and small vessel ischemic disease. No acute intracranial abnormality. Electronically Signed   By: Tollie Eth M.D.   On: 05/25/2017 18:52    Procedures Procedures (including critical care time)  Medications Ordered in ED Medications  0.9 %  sodium chloride infusion ( Intravenous New Bag/Given 05/25/17 1831)  sodium chloride 0.9 % bolus 500 mL (500 mLs Intravenous New Bag/Given 05/25/17 1831)     Initial Impression / Assessment and Plan / ED Course  I have reviewed the triage vital signs and the nursing notes.  Pertinent labs & imaging results that were available during my care of  the patient were reviewed by me and considered in my medical decision making (see chart for details).    Patient's family would like to have him placed in a nursing facility.  Workup here was looking for a medical reason for admission.  Blood pressures that they got up in the doctor's office may have not been accurate because of all her blood pressures had systolics greater than 100.  No trend for hypotension.  Patient clearly is a failure to thrive not able to take care of himself.  Urinalysis negative for UTI chest x-ray negative for pneumonia head CT without any findings.  Labs without any significant abnormalities actually look better than they did earlier in the month.  Contacted Child psychotherapist gave patient's contact information and family information they will contact the family tomorrow to help them sort through placement.  Also recommend follow back up with primary care provider.  Patient clinically looked dehydrated he did receive some IV fluids here.  Which seemed to perk him up.   Final Clinical Impressions(s) / ED Diagnoses   Final diagnoses:  Weakness  Failure to thrive (0-17)  Dementia without behavioral disturbance, unspecified dementia type    ED Discharge Orders    None       Vanetta Mulders, MD 05/25/17 2319

## 2017-05-25 NOTE — Telephone Encounter (Signed)
I finished patient's complete note today.  Did HPI, review of systems, physical exam and assessment/plan.  The note was completed in its entirety at the time sent to the emergency department or shortly thereafter.  Today when I go in to check on patient status and click it only shows assessment and plan.  It appears that I did not finish the note.  When I go to try to addend and repeat the note again in its entirety as originally intended but when I accept and sign does not register. Later recheck my note it still looks only partially done. Can only visualize when in process of addending(very frustrating)  This is quite frustrating as anyone looks at this particularly the emergency department would not understand recent context and my reasoning.  I cannot even addendum my note and how.  Could you contact the right person in epic to help me thinks is probably can they fix it?  If you would give them my cell phone number may be they can call me tomorrow.  I will be in the office.

## 2017-05-25 NOTE — Discharge Instructions (Signed)
Workup here today without any acute abnormalities warranting admission.  Blood pressures stabilized out from the visit upstairs and systolic pressures been consistently above 100.  Contacted Child psychotherapistsocial worker they took down your office contact information and they will contact you tomorrow to assist with placement options.  Also keep in mind primary care provider can be of assistance sometimes this can be just taking care of over the phone.

## 2017-05-26 ENCOUNTER — Telehealth: Payer: Self-pay | Admitting: Medical

## 2017-05-26 MED ORDER — NYSTATIN 100000 UNIT/GM EX CREA
TOPICAL_CREAM | CUTANEOUS | 1 refills | Status: AC
Start: 2017-05-26 — End: ?

## 2017-05-26 NOTE — Telephone Encounter (Signed)
I saw that pt was not admitted to the hospital. No admission criteria per ED note.   So I did send in nystatin cream.  Groin rash was an issue that had mentioned before I sent them down to the emergency department.  It was not addressed by the ED so please notify them that I sent in the nystatin cream.  In addition to the cream tried to keep the area as dry as possible.

## 2017-05-26 NOTE — Telephone Encounter (Signed)
Talked to patient's daughter Tom Duarte, she was informed rx was sent in for her dad to address the problem with the groin rash. She was very appreciative, she will p/up today.  Also advised to keep area as dry as possible.

## 2017-05-31 ENCOUNTER — Telehealth: Payer: Self-pay | Admitting: Medical

## 2017-05-31 ENCOUNTER — Other Ambulatory Visit: Payer: Self-pay

## 2017-05-31 MED ORDER — SERTRALINE HCL 50 MG PO TABS: 50.0000 mg | ORAL_TABLET | ORAL | 3 refills | Status: AC

## 2017-05-31 NOTE — Telephone Encounter (Signed)
Copied from CRM [phone]. Topic: General - Other >> May 31, 2017 10:13 AM Eston Mouldavis, Cheri B wrote: Reason for CRM: Pt's daughter, [name], states she has found a SNF for her father and would like Esperanza Richtersdward Saguier to fill out FL2  papers to have have this process started.    [name] can be reached at [phone].  She states she is pleading with Saguier to get this done asap. She states it urgent. >> May 31, 2017 10:34 AM Arlyss Gandyichardson, Vyncent Overby N, NT wrote: Pts daughter calling back and states [facility]. If a place doesn't have to be put on the form the daughter would like for it not to placed on the form.

## 2017-06-02 ENCOUNTER — Telehealth: Payer: Self-pay | Admitting: Medical

## 2017-06-02 ENCOUNTER — Ambulatory Visit: Payer: Medicare Other | Admitting: Medical

## 2017-06-02 NOTE — Telephone Encounter (Signed)
I tried to call daughter today to talk about her dad's recent fall and trying to [facility] out the [facility] for her.  See that she had called earlier in the week.  It was a conversation started the patient engagement center and sent to to staff members in our office.  Message was not sent to me directly.  It is possible they were waiting for patient to drop off to form.  Current concern is did patient suffer injury from fall today.  Patient might have some pressure ulcers.  He has difficulty standing last time I saw him.  I had tried to get him home health and they had stated he needed skilled nursing facility.  Last time I saw him, he looked fatigued and unstable and I want him evaluated at the emergency department.  The ED evaluating him and determined there was no criteria for admission.  Today is the first time being updated on his current condition.  I was in the middle of seen patients earlier this morning when son came by to speak with me.  I was already behind seeing patients and did not have time.

## 2017-06-05 ENCOUNTER — Telehealth: Payer: Self-pay | Admitting: Pulmonary Disease

## 2017-06-05 ENCOUNTER — Telehealth: Payer: Self-pay

## 2017-06-05 ENCOUNTER — Ambulatory Visit: Payer: Medicare Other | Admitting: Medical

## 2017-06-05 ENCOUNTER — Telehealth: Payer: Self-pay | Admitting: Medical

## 2017-06-05 NOTE — Telephone Encounter (Signed)
No charge but went side was on the phone with me I put him on hold and went up front to make sure his appointment time was made and specifically scheduled for a 45-minute slot.

## 2017-06-05 NOTE — Telephone Encounter (Signed)
Copied from CRM #[id number]. Topic: General - Other >> Jun 05, 2017  1:39 PM Terisa Starraylor, Brittany L wrote: Patient's son called and said he was unware of the last 2 appts. He said the one he had on the 18th, he fell and EMS was called & refused to go to the dr. His son sent pics of his bedsores to [employer] Saguier. He did not know the appt had been made & does not want to be charged. Call back is [phone] Lorin Picket(Scott)

## 2017-06-05 NOTE — Telephone Encounter (Signed)
Will hold message until 06/06/17 to discuss with PM.

## 2017-06-05 NOTE — Telephone Encounter (Signed)
I had talked to patient son on Friday at lunch. Had 15 minute conversation with son about his dads recent fall in house and that is why he missed appointment with me. Some bruising and the fact ems came out to evaluate. They wanted to take him to the ED but pt refused care. EMS told him they would not take him to ED  if his dad refused. Pt states at that time his dad seemed to be in normal state of mind. Not confused. In the past he often has refused any care and sometimes when had severe signs and symptoms/or abnormal labs he refused ED evaluation.  Son note he started to have some breakdown in his bottom area. He described decubitus ulcer.   I explained to son on Friday I want to see him and dad on Monday. I put him on hold and went up front to make sure I could see him at convenient time early in afternoon at 1 pm and blocked off until 1:45. I advised son on Friday would see [name] on Monday.   I explained I need to see the breakdown area to get try to initiate home health referral/wound care(but per medicare rules actually have to see in office). During the mean time I stated if he could send me picture of the area so I can evaluate region. I had plans to put referral in epic and just sign off on referral  when he came in.   I was hoping they would come in to assess [name] and talk with him to see if he would be agreeable to going to nursing home and letting us sign fl2. Per son he was adamantly refusing this idea. Also was considering doing some labs to maybe see if can find potential admittable diagnosis or current reason to refer to ED if needed.  You know they missed that appointment on Monday/yesterday.  I see that he is coming in on Wednesday. Would you please make sure they get reminder call on Tuesday. Would you call both his son and daughter and remind them both. They are both taking care of him.  Also will you show Tom Duarte those home health referrals I put in and will  you see if she will look into those. Let me know what gwenn says. Need update by tomorrow morning.

## 2017-06-06 NOTE — Telephone Encounter (Signed)
Per PM- see if pt can come in at 2:00 and hold 3:00. [facility] to [facility].

## 2017-06-06 NOTE — Telephone Encounter (Signed)
I saw that you did put in order in epic today.  It looks like you attached the information I filled out.  I would recommend that [facility] call daughter of patient and see which home health organization went out initially to evaluate him.  At that time they thought he was too complex and needed skilled nursing facility.  He might be doing a little better now.  But would recommend she try another organization rather than the one that already went out.

## 2017-06-07 ENCOUNTER — Ambulatory Visit: Payer: Medicare Other | Admitting: Medical

## 2017-06-07 NOTE — Telephone Encounter (Signed)
Completed.

## 2017-06-08 ENCOUNTER — Telehealth: Payer: Self-pay | Admitting: Medical

## 2017-06-08 NOTE — Telephone Encounter (Signed)
Called Patient son: Tom Duarte - and stts that [place]. Patient fell with daughter and broker shoulder. Patient son stts he will be starting him new primary coverage.  Son stts you may call him if you have any questions/   Adv that I will forward message to PCP so he can be informed.

## 2017-06-08 NOTE — Telephone Encounter (Signed)
I talked with patient's daughter Tom Duarte today.  This weekend he states her [place].  He has a hospital bed.  She is thinks that he will be able to establish care near describes house pretty  Quickly.  The small breakdown in his sacral region has not worsened.  It is minimal per daughter.  She explains [facility] had [facility] or Wednesdays since things look stable and they are going to transfer care.  I have called to inquire if the break down area was severe.  Since they had not come in I was considering possibly driving to Gail's house and checking the area if her dad needed stat referral for home health wound care or to be referred to wound care.  I had not written the wound care referral as of yet since I understand that it is Medicare guideline that the visit for determination of services needs to be person to person.  Tom Duarte states that things are stable and I do not need to go out.  Though she did thank me.  Since they are transferring to another provider and he is moving from the area this weekend I will go ahead and remove myself as his PCP in epic.

## 2017-06-09 DIAGNOSIS — S42292A Other displaced fracture of upper end of left humerus, initial encounter for closed fracture: Secondary | ICD-10-CM | POA: Diagnosis not present

## 2017-06-09 NOTE — Telephone Encounter (Signed)
Pt has an appt with Pm tomorrow.at 2pm

## 2017-06-12 ENCOUNTER — Ambulatory Visit (INDEPENDENT_AMBULATORY_CARE_PROVIDER_SITE_OTHER): Payer: Medicare Other | Admitting: Pulmonary Disease

## 2017-06-12 ENCOUNTER — Encounter: Payer: Self-pay | Admitting: Pulmonary Disease

## 2017-06-12 ENCOUNTER — Institutional Professional Consult (permissible substitution): Payer: PRIVATE HEALTH INSURANCE | Admitting: Pulmonary Disease

## 2017-06-12 VITALS — BP 108/68 | HR 62 | Ht 70.0 in | Wt 118.0 lb

## 2017-06-12 DIAGNOSIS — R0902 Hypoxemia: Secondary | ICD-10-CM

## 2017-06-12 DIAGNOSIS — J438 Other emphysema: Secondary | ICD-10-CM

## 2017-06-12 MED ORDER — IPRATROPIUM-ALBUTEROL 0.5-2.5 (3) MG/3ML IN SOLN: 3.0000 mL | Freq: Four times a day (QID) | RESPIRATORY_TRACT | 3 refills | Status: AC | PRN

## 2017-06-12 MED ORDER — ARFORMOTEROL TARTRATE 15 MCG/2ML IN NEBU: 15.0000 ug | INHALATION_SOLUTION | Freq: Two times a day (BID) | RESPIRATORY_TRACT | 6 refills | Status: AC

## 2017-06-12 MED ORDER — BUDESONIDE 0.5 MG/2ML IN SUSP: 0.5000 mg | Freq: Two times a day (BID) | RESPIRATORY_TRACT | 12 refills | Status: AC

## 2017-06-12 NOTE — Patient Instructions (Signed)
We will schedule you for pulmonary function test Stop the Symbicort.  We will start you on Brovana and Pulmicort nebulizer twice daily Follow-up in 1-2 months.

## 2017-06-12 NOTE — Progress Notes (Signed)
Tom Duarte    [id number]    01-31-1929  Primary Care Physician:Patient, No Pcp Per  Referring Physician: Esperanza Richters, PA-C [address] [place], [place] [address]  Chief complaint: Consult for COPD  HPI: 84 year old with history of CAD, hypertension, dementia.  He had a fall on Christmas day 2018 with fracture to the left humerus.  Deemed a nonoperative candidate.  Noted to be hypoxic to 85% on room air in the ED which improved his discharge Since his discharge he has been living with his family.  It was determined that he would benefit from a skilled nursing facility/rehab but the patient does not want to go to this place.  Home health care has stated that his needs are too much for them to take care of.  His family has been trying to cope themselves.  He currently alternates living with his son and daughter  Complains of severe dyspnea on exertion.  He gets short of breath with minimal exertion.  Has symptoms at rest.  Denies any cough, sputum production, wheezing.  He has been prescribed Symbicort but is unable to take this reliably as he cannot follow instructions well.  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  Quit in December 2018 Travel History: Frequent  Outpatient Encounter Medications as of 06/12/2017  Medication Sig  . albuterol (VENTOLIN HFA) 108 (90 Base) MCG/ACT inhaler Inhale 2 puffs into the lungs every 6 (six) hours as needed for wheezing or shortness of breath.  . budesonide-formoterol (SYMBICORT) 160-4.5 MCG/ACT inhaler Inhale 2 puffs into the lungs 2 (two) times daily.  Marland Kitchen nystatin cream (MYCOSTATIN) Apply thin film twice daily to groin rash.  . sertraline (ZOLOFT) 50 MG tablet Take 1 tablet (50 mg total) by mouth  See admin instructions. 50 mg in morning  And 25 mg QHS  . aspirin 325 MG tablet Take 325 mg by mouth daily. 2 daily   No facility-administered encounter medications on file as of 06/12/2017.     Allergies as of 06/12/2017  . (No Known Allergies)    Past Medical History:  Diagnosis Date  . Anxiety   . Cataract   . Depression    Years ago not current per patient  . Emphysema of lung (HCC)   . Hypertension     Past Surgical History:  Procedure Laterality Date  . HERNIA REPAIR     rt side. year 2000.  Marland Kitchen HIP SURGERY     Joint replacement  . LEG SURGERY     Left    Family History  Problem Relation Age of Onset  . GER disease Mother   . Heart disease Father     Social History   Socioeconomic History  . Marital status: Divorced    Spouse name: Not on file  . Number of children: Not on file  . Years of education: Not on file  . Highest education level: Not on file  Social Needs  . Financial resource strain: Not on file  . Food insecurity - worry: Not on file  . Food insecurity - inability: Not on file  . Transportation needs - medical: Not on file  . Transportation needs - non-medical: Not on file  Occupational History  . Not on file  Tobacco Use  . Smoking status: Former Smoker    Packs/day: 0.50    Years: 70.00    Pack years: 35.00    Types: Cigarettes    Last attempt to quit: 04/15/2017    Years since quitting: 0.1  . Smokeless tobacco: Never Used  Substance and Sexual Activity  . Alcohol use: Yes    Alcohol/week: 0.0 oz    Comment: occasionly  . Drug use: No  . Sexual activity: No  Other Topics Concern  . Not on file  Social History Narrative  . Not on file    Review of systems: Review of Systems  Constitutional: Negative for fever and chills.  HENT: Negative.   Eyes: Negative for blurred vision.  Respiratory: as per HPI  Cardiovascular: Negative for chest pain and palpitations.  Gastrointestinal: Negative for vomiting, diarrhea, blood per  rectum. Genitourinary: Negative for dysuria, urgency, frequency and hematuria.  Musculoskeletal: Negative for myalgias, back pain and joint pain.  Skin: Negative for itching and rash.  Neurological: Negative for dizziness, tremors, focal weakness, seizures and loss of consciousness.  Endo/Heme/Allergies: Negative for environmental allergies.  Psychiatric/Behavioral: Negative for depression, suicidal ideas and hallucinations.  All other systems reviewed and are negative.  Physical Exam: Blood pressure 108/68, pulse 62, height 5\' 10"  (1.778 m), weight 118 lb (53.5 kg), SpO2 99 %. Gen:      No acute distress HEENT:  EOMI, sclera anicteric Neck:     No masses; no thyromegaly Lungs:    Clear to auscultation bilaterally; normal respiratory effort CV:         Regular rate and rhythm; no murmurs Abd:      + bowel sounds; soft, non-tender; no palpable masses, no distension Ext:    No edema; adequate peripheral perfusion Skin:      Warm and dry; no rash Neuro: alert and oriented x 3 Psych: normal mood and affect  Data Reviewed: CT chest 04/10/17-6 mm left upper lobe nodule, severe emphysematous changes, scarring in the left base and lingula.  Calcified pleural plaques in the left hemithorax I have reviewed the images personally  Assessment:  Dyspnea on exertion Likely has severe COPD based on emphysematous changes on CT scan and smoking history. Unable to use Symbicort due to problems with following instructions and dexterity.  I will switch him over to Brovana and Pulmicort nebulizer Add duonebs as needed  Schedule for pulmonary function test.  Monitor need for supplemental oxygen.  We did not ambulate him today as he was unsteady on his feet He may benefit from a palliative care approach with [facility].  I have broached this with the family and will continue discussions going forward.  Pleural plaques, lung scarring Consistent with asbestos exposure although he cannot recall such  exposure.  Check PFTs and continue to monitor.  Pulmonary nodule Follow up CT in 1 year.   Plan/Recommendations: - PFTs - Stop symbicort, start brovana, pulmicort - Duonebs as needed  Chilton GreathousePraveen Iyanla Eilers MD [facility] Pager [phone] 06/12/2017, 2:23 PM  CC: Esperanza RichtersSaguier, Edward, PA-C

## 2017-06-14 DIAGNOSIS — F331 Major depressive disorder, recurrent, moderate: Secondary | ICD-10-CM | POA: Diagnosis not present

## 2017-06-14 DIAGNOSIS — R531 Weakness: Secondary | ICD-10-CM | POA: Diagnosis not present

## 2017-06-14 DIAGNOSIS — J440 Chronic obstructive pulmonary disease with acute lower respiratory infection: Secondary | ICD-10-CM | POA: Diagnosis not present

## 2017-06-14 DIAGNOSIS — Z7689 Persons encountering health services in other specified circumstances: Secondary | ICD-10-CM | POA: Diagnosis not present

## 2017-06-15 ENCOUNTER — Telehealth: Payer: Self-pay | Admitting: Pulmonary Disease

## 2017-06-15 DIAGNOSIS — J449 Chronic obstructive pulmonary disease, unspecified: Secondary | ICD-10-CM | POA: Diagnosis not present

## 2017-06-15 DIAGNOSIS — F331 Major depressive disorder, recurrent, moderate: Secondary | ICD-10-CM | POA: Diagnosis not present

## 2017-06-15 NOTE — Telephone Encounter (Signed)
These forms have been placed in PM sign folder.  Will forward to PM to make him aware that these will need to be signed. Thanks

## 2017-06-16 ENCOUNTER — Telehealth: Payer: Self-pay | Admitting: Pulmonary Disease

## 2017-06-16 NOTE — Telephone Encounter (Signed)
Called [facility] and spoke with Misty StanleyLisa letting her know I did fax over pt's OV from 06/12/17 that it was in the process of going to them now.  Misty StanleyLisa expressed understanding. Nothing further needed at this current time.

## 2017-06-16 NOTE — Telephone Encounter (Signed)
Forms have been completed by PM and faxed back to [facility]. Nothing further is needed.

## 2017-06-20 DIAGNOSIS — F331 Major depressive disorder, recurrent, moderate: Secondary | ICD-10-CM | POA: Diagnosis not present

## 2017-06-20 DIAGNOSIS — J449 Chronic obstructive pulmonary disease, unspecified: Secondary | ICD-10-CM | POA: Diagnosis not present

## 2017-06-21 DIAGNOSIS — J984 Other disorders of lung: Secondary | ICD-10-CM | POA: Diagnosis not present

## 2017-06-21 DIAGNOSIS — R0600 Dyspnea, unspecified: Secondary | ICD-10-CM | POA: Diagnosis not present

## 2017-06-21 DIAGNOSIS — E43 Unspecified severe protein-calorie malnutrition: Secondary | ICD-10-CM | POA: Diagnosis not present

## 2017-06-21 DIAGNOSIS — Z515 Encounter for palliative care: Secondary | ICD-10-CM | POA: Diagnosis not present

## 2017-06-21 DIAGNOSIS — M9701XA Periprosthetic fracture around internal prosthetic right hip joint, initial encounter: Secondary | ICD-10-CM | POA: Diagnosis not present

## 2017-06-21 DIAGNOSIS — M25551 Pain in right hip: Secondary | ICD-10-CM | POA: Diagnosis not present

## 2017-06-21 DIAGNOSIS — W19XXXA Unspecified fall, initial encounter: Secondary | ICD-10-CM | POA: Diagnosis not present

## 2017-06-21 DIAGNOSIS — R918 Other nonspecific abnormal finding of lung field: Secondary | ICD-10-CM | POA: Diagnosis not present

## 2017-06-21 DIAGNOSIS — J9601 Acute respiratory failure with hypoxia: Secondary | ICD-10-CM | POA: Diagnosis not present

## 2017-06-21 DIAGNOSIS — E440 Moderate protein-calorie malnutrition: Secondary | ICD-10-CM | POA: Diagnosis not present

## 2017-06-21 DIAGNOSIS — I5021 Acute systolic (congestive) heart failure: Secondary | ICD-10-CM | POA: Diagnosis not present

## 2017-06-21 DIAGNOSIS — R069 Unspecified abnormalities of breathing: Secondary | ICD-10-CM | POA: Diagnosis not present

## 2017-06-21 DIAGNOSIS — F331 Major depressive disorder, recurrent, moderate: Secondary | ICD-10-CM | POA: Diagnosis not present

## 2017-06-21 DIAGNOSIS — S72001A Fracture of unspecified part of neck of right femur, initial encounter for closed fracture: Secondary | ICD-10-CM | POA: Diagnosis not present

## 2017-06-21 DIAGNOSIS — J438 Other emphysema: Secondary | ICD-10-CM | POA: Diagnosis not present

## 2017-06-21 DIAGNOSIS — G9340 Encephalopathy, unspecified: Secondary | ICD-10-CM | POA: Diagnosis not present

## 2017-06-22 DIAGNOSIS — H9193 Unspecified hearing loss, bilateral: Secondary | ICD-10-CM | POA: Diagnosis present

## 2017-06-22 DIAGNOSIS — R630 Anorexia: Secondary | ICD-10-CM | POA: Diagnosis not present

## 2017-06-22 DIAGNOSIS — Z66 Do not resuscitate: Secondary | ICD-10-CM | POA: Diagnosis present

## 2017-06-22 DIAGNOSIS — R2681 Unsteadiness on feet: Secondary | ICD-10-CM | POA: Diagnosis present

## 2017-06-22 DIAGNOSIS — R9389 Abnormal findings on diagnostic imaging of other specified body structures: Secondary | ICD-10-CM | POA: Diagnosis not present

## 2017-06-22 DIAGNOSIS — J439 Emphysema, unspecified: Secondary | ICD-10-CM | POA: Diagnosis present

## 2017-06-22 DIAGNOSIS — R339 Retention of urine, unspecified: Secondary | ICD-10-CM | POA: Diagnosis not present

## 2017-06-22 DIAGNOSIS — F331 Major depressive disorder, recurrent, moderate: Secondary | ICD-10-CM | POA: Diagnosis present

## 2017-06-22 DIAGNOSIS — I509 Heart failure, unspecified: Secondary | ICD-10-CM | POA: Diagnosis not present

## 2017-06-22 DIAGNOSIS — J9621 Acute and chronic respiratory failure with hypoxia: Secondary | ICD-10-CM | POA: Diagnosis not present

## 2017-06-22 DIAGNOSIS — Z87891 Personal history of nicotine dependence: Secondary | ICD-10-CM | POA: Diagnosis not present

## 2017-06-22 DIAGNOSIS — M7989 Other specified soft tissue disorders: Secondary | ICD-10-CM | POA: Diagnosis not present

## 2017-06-22 DIAGNOSIS — I517 Cardiomegaly: Secondary | ICD-10-CM | POA: Diagnosis not present

## 2017-06-22 DIAGNOSIS — R1312 Dysphagia, oropharyngeal phase: Secondary | ICD-10-CM | POA: Diagnosis present

## 2017-06-22 DIAGNOSIS — S72011A Unspecified intracapsular fracture of right femur, initial encounter for closed fracture: Secondary | ICD-10-CM | POA: Diagnosis not present

## 2017-06-22 DIAGNOSIS — S42212A Unspecified displaced fracture of surgical neck of left humerus, initial encounter for closed fracture: Secondary | ICD-10-CM | POA: Diagnosis not present

## 2017-06-22 DIAGNOSIS — J438 Other emphysema: Secondary | ICD-10-CM | POA: Diagnosis not present

## 2017-06-22 DIAGNOSIS — Z96641 Presence of right artificial hip joint: Secondary | ICD-10-CM | POA: Diagnosis not present

## 2017-06-22 DIAGNOSIS — Z515 Encounter for palliative care: Secondary | ICD-10-CM | POA: Diagnosis not present

## 2017-06-22 DIAGNOSIS — R05 Cough: Secondary | ICD-10-CM | POA: Diagnosis not present

## 2017-06-22 DIAGNOSIS — I080 Rheumatic disorders of both mitral and aortic valves: Secondary | ICD-10-CM | POA: Diagnosis not present

## 2017-06-22 DIAGNOSIS — Z681 Body mass index (BMI) 19 or less, adult: Secondary | ICD-10-CM | POA: Diagnosis not present

## 2017-06-22 DIAGNOSIS — S7290XA Unspecified fracture of unspecified femur, initial encounter for closed fracture: Secondary | ICD-10-CM | POA: Diagnosis not present

## 2017-06-22 DIAGNOSIS — R5381 Other malaise: Secondary | ICD-10-CM | POA: Diagnosis not present

## 2017-06-22 DIAGNOSIS — M9701XA Periprosthetic fracture around internal prosthetic right hip joint, initial encounter: Secondary | ICD-10-CM | POA: Diagnosis present

## 2017-06-22 DIAGNOSIS — R918 Other nonspecific abnormal finding of lung field: Secondary | ICD-10-CM | POA: Diagnosis not present

## 2017-06-22 DIAGNOSIS — Z7951 Long term (current) use of inhaled steroids: Secondary | ICD-10-CM | POA: Diagnosis not present

## 2017-06-22 DIAGNOSIS — I5021 Acute systolic (congestive) heart failure: Secondary | ICD-10-CM | POA: Diagnosis not present

## 2017-06-22 DIAGNOSIS — Z79899 Other long term (current) drug therapy: Secondary | ICD-10-CM | POA: Diagnosis not present

## 2017-06-22 DIAGNOSIS — J811 Chronic pulmonary edema: Secondary | ICD-10-CM | POA: Diagnosis not present

## 2017-06-22 DIAGNOSIS — F0390 Unspecified dementia without behavioral disturbance: Secondary | ICD-10-CM | POA: Diagnosis present

## 2017-06-22 DIAGNOSIS — Z9181 History of falling: Secondary | ICD-10-CM | POA: Diagnosis not present

## 2017-06-22 DIAGNOSIS — F329 Major depressive disorder, single episode, unspecified: Secondary | ICD-10-CM | POA: Diagnosis not present

## 2017-06-22 DIAGNOSIS — M25551 Pain in right hip: Secondary | ICD-10-CM | POA: Diagnosis not present

## 2017-06-22 DIAGNOSIS — Z7401 Bed confinement status: Secondary | ICD-10-CM | POA: Diagnosis not present

## 2017-06-22 DIAGNOSIS — E440 Moderate protein-calorie malnutrition: Secondary | ICD-10-CM | POA: Diagnosis not present

## 2017-06-22 DIAGNOSIS — Z471 Aftercare following joint replacement surgery: Secondary | ICD-10-CM | POA: Diagnosis not present

## 2017-06-22 DIAGNOSIS — J9601 Acute respiratory failure with hypoxia: Secondary | ICD-10-CM | POA: Diagnosis not present

## 2017-06-22 DIAGNOSIS — I350 Nonrheumatic aortic (valve) stenosis: Secondary | ICD-10-CM | POA: Diagnosis present

## 2017-06-22 DIAGNOSIS — G9340 Encephalopathy, unspecified: Secondary | ICD-10-CM | POA: Diagnosis present

## 2017-06-22 DIAGNOSIS — M25552 Pain in left hip: Secondary | ICD-10-CM | POA: Diagnosis not present

## 2017-06-22 DIAGNOSIS — J431 Panlobular emphysema: Secondary | ICD-10-CM | POA: Diagnosis not present

## 2017-06-22 DIAGNOSIS — S72001A Fracture of unspecified part of neck of right femur, initial encounter for closed fracture: Secondary | ICD-10-CM | POA: Diagnosis present

## 2017-06-22 DIAGNOSIS — J449 Chronic obstructive pulmonary disease, unspecified: Secondary | ICD-10-CM | POA: Diagnosis not present

## 2017-06-22 DIAGNOSIS — R1310 Dysphagia, unspecified: Secondary | ICD-10-CM | POA: Diagnosis not present

## 2017-06-22 DIAGNOSIS — R410 Disorientation, unspecified: Secondary | ICD-10-CM | POA: Diagnosis not present

## 2017-06-22 DIAGNOSIS — I340 Nonrheumatic mitral (valve) insufficiency: Secondary | ICD-10-CM | POA: Diagnosis present

## 2017-06-22 DIAGNOSIS — E43 Unspecified severe protein-calorie malnutrition: Secondary | ICD-10-CM | POA: Diagnosis present

## 2017-06-22 DIAGNOSIS — I371 Nonrheumatic pulmonary valve insufficiency: Secondary | ICD-10-CM | POA: Diagnosis present

## 2017-06-22 DIAGNOSIS — R0602 Shortness of breath: Secondary | ICD-10-CM | POA: Diagnosis not present

## 2017-06-22 DIAGNOSIS — I110 Hypertensive heart disease with heart failure: Secondary | ICD-10-CM | POA: Diagnosis not present

## 2017-07-14 DEATH — deceased

## 2017-07-18 ENCOUNTER — Other Ambulatory Visit: Payer: Self-pay

## 2017-07-18 MED ORDER — BUDESONIDE-FORMOTEROL FUMARATE 160-4.5 MCG/ACT IN AERO: 2.0000 | INHALATION_SPRAY | Freq: Two times a day (BID) | RESPIRATORY_TRACT | 5 refills | Status: AC

## 2017-07-26 ENCOUNTER — Ambulatory Visit: Payer: Medicare Other | Admitting: Pulmonary Disease

## 2017-08-30 ENCOUNTER — Telehealth: Payer: Self-pay | Admitting: Medical

## 2017-08-30 NOTE — Telephone Encounter (Signed)
Spoke with Tom Duarte, son of Tom Duarte, and he reported that his father passed away in February 2019 and would like for his information to be updated in the system. SF

## 2019-07-04 IMAGING — DX DG CHEST 2V
4 series · 4 of 4 positions shown · non-contrast
Comparison: 05/09/2017

CLINICAL DATA: Hypertension and fatigue.

EXAM:
CHEST  2 VIEW

[chest lat (1 of 2)]
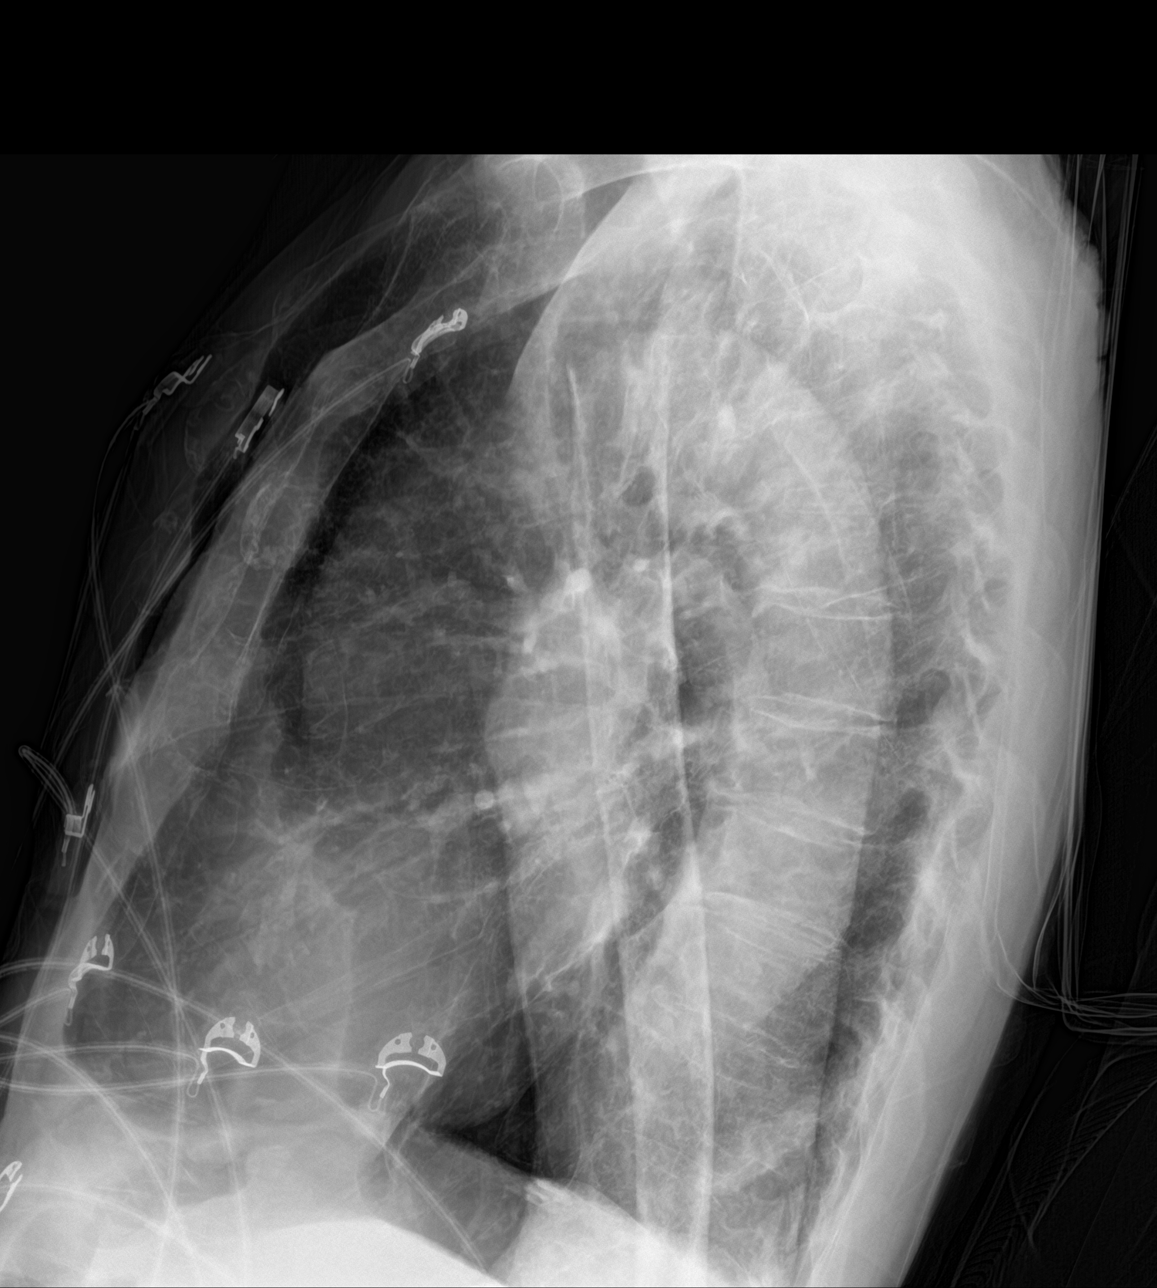

[chest ap strecther (1 of 2)]
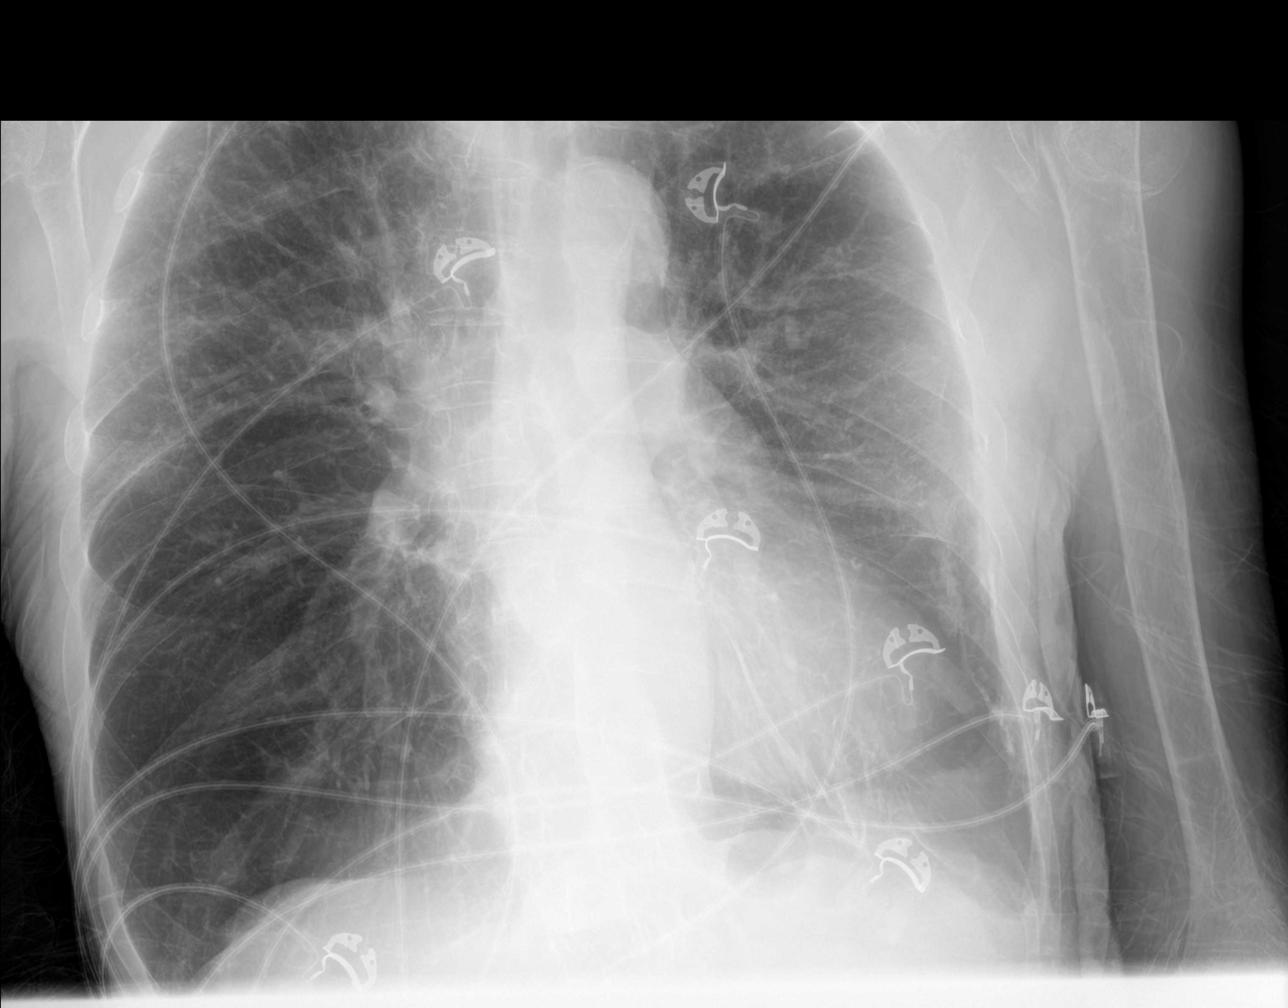

[chest lat (2 of 2)]
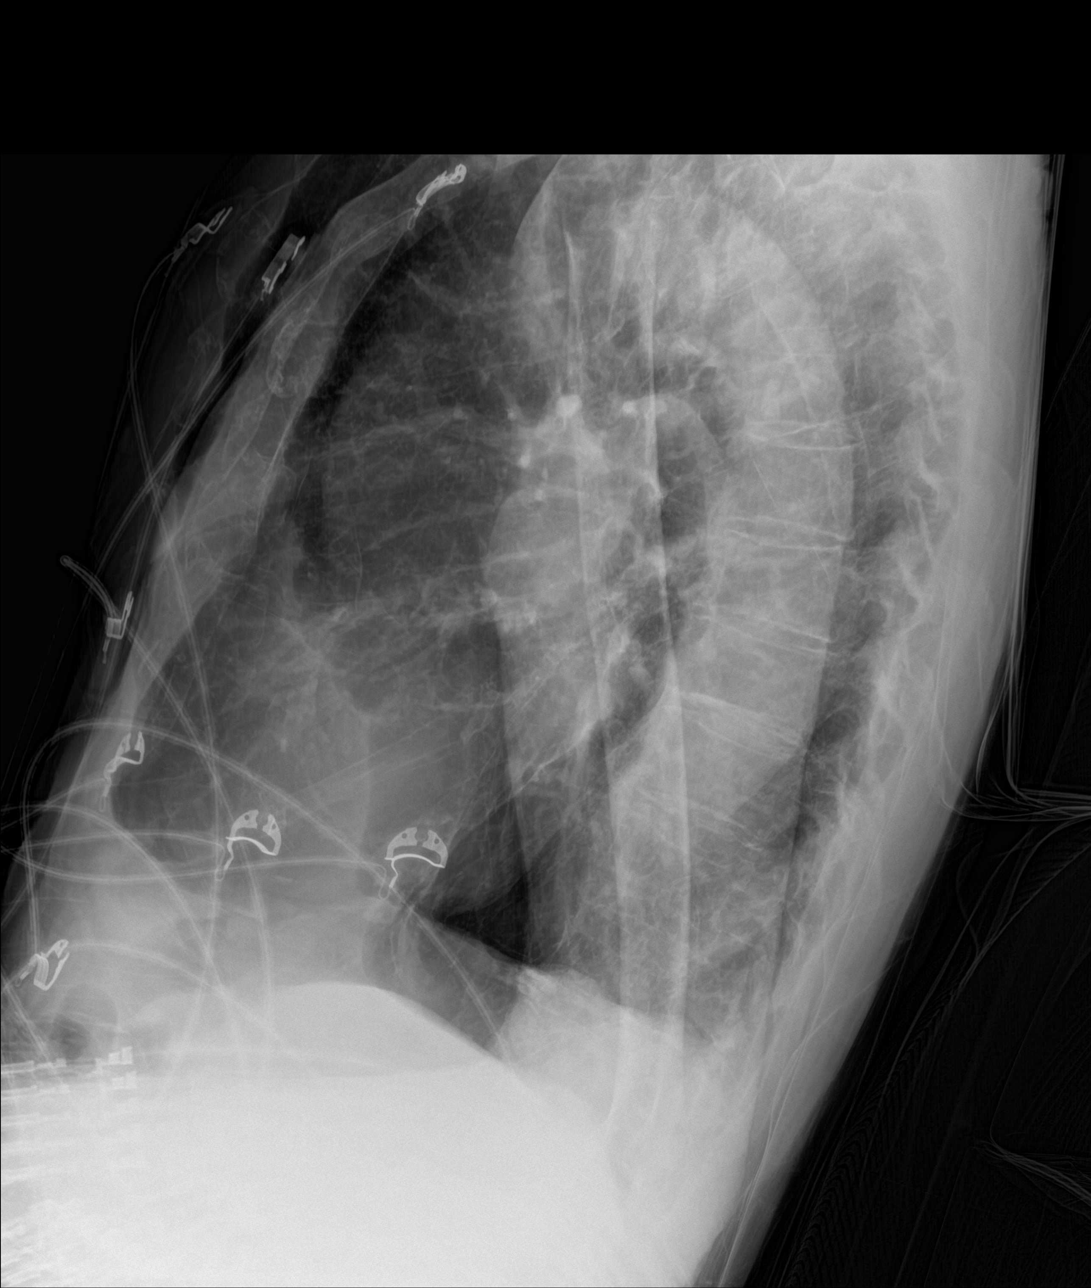

[chest ap strecther (2 of 2)]
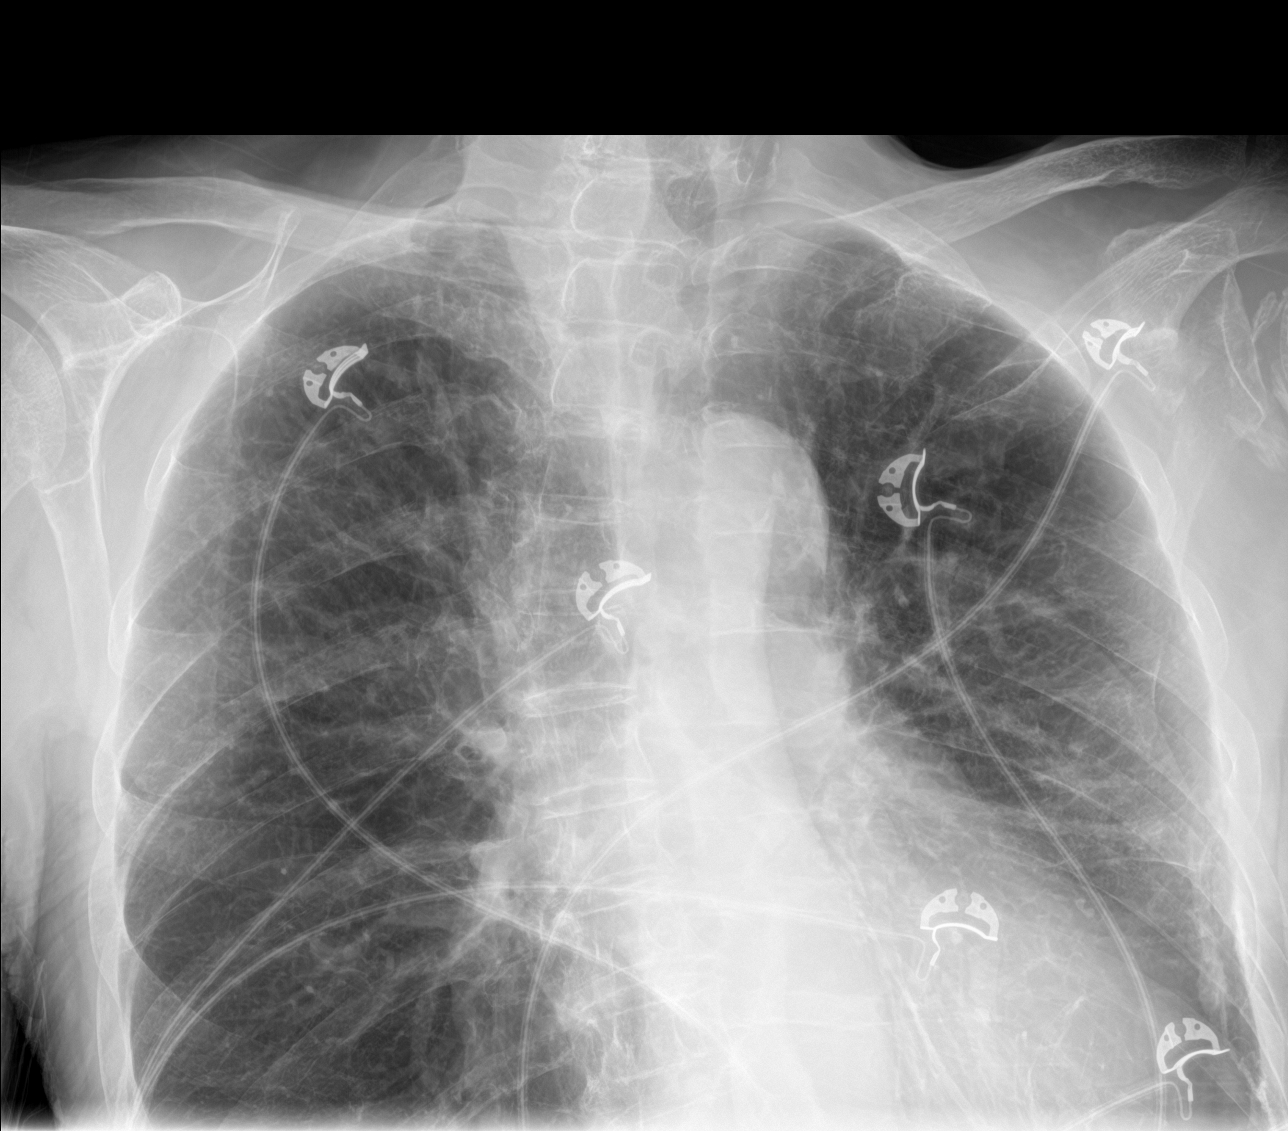

[4 of 4 positions shown; findings below may reference images not displayed]

FINDINGS: Mildly enlarged cardiac silhouette. Calcific atherosclerotic disease
and tortuosity of the aorta.

Stable lower lobe predominant emphysematous changes with
architectural distortion and left pleural/subpleural scarring.
Possible small left pleural effusion.

Stable fracture of the left humerus.
IMPRESSION: Stable emphysematous changes and chronic pleural/parenchymal
scarring with nodular subpleural density in the left lower lobe.

Possible small left pleural effusion.

## 2019-07-04 IMAGING — CT CT HEAD W/O CM
3 series · 16 of 47 positions shown, 19 images · non-contrast
Comparison: 05/09/2017

CLINICAL DATA: 88-year-old male with altered level of
consciousness.

EXAM:
CT HEAD WITHOUT CONTRAST
TECHNIQUE: Contiguous axial images were obtained from the base of the skull
through the vertex without intravenous contrast.

[Series 2: head wo · axial · 0.44mm/px · z∈[+1114,+1264]mm · 10 of 36 slices shown, 13 images]
[im 3/36  brain]
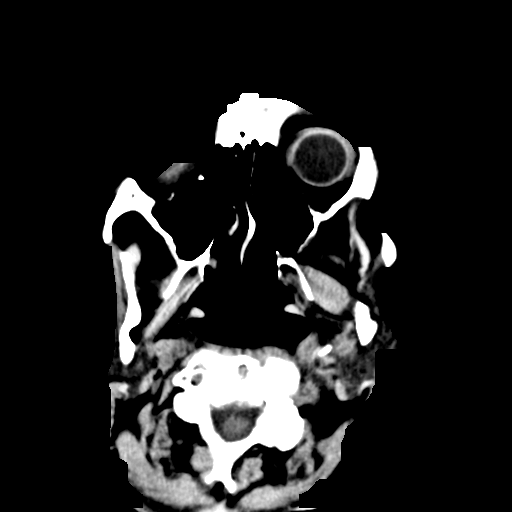
[im 3/36  bone]
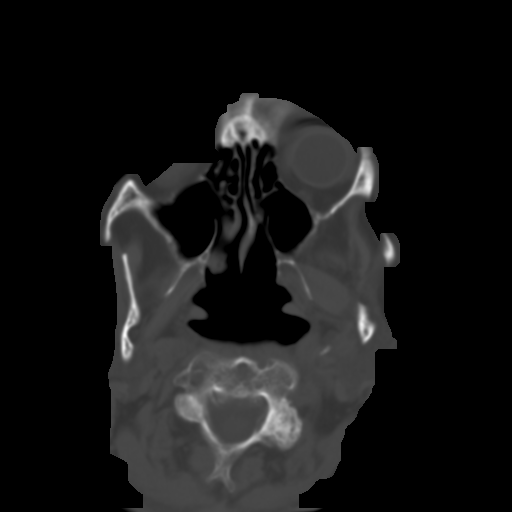
[im 7/36  brain]
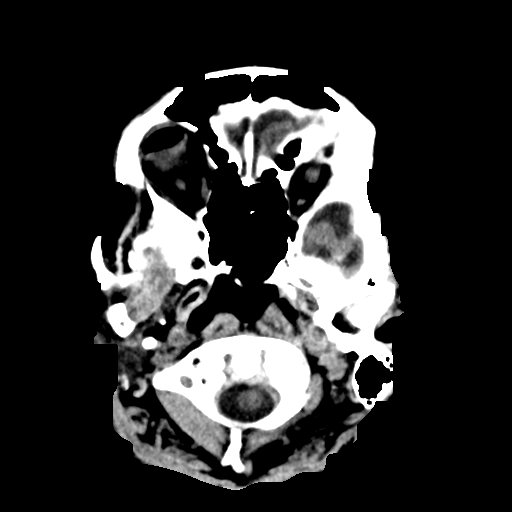
[im 10/36  brain]
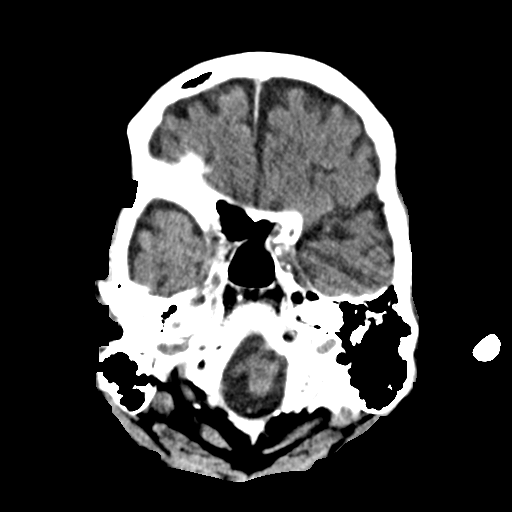
[im 13/36  brain]
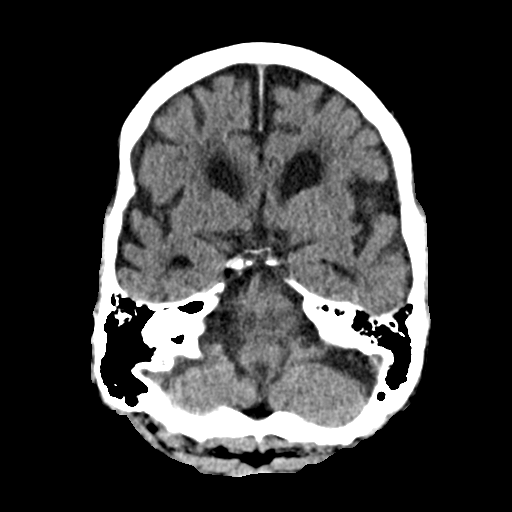
[im 16/36  brain]
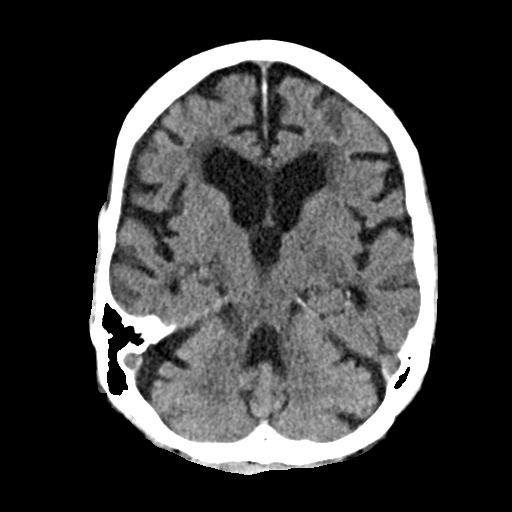
[im 16/36  bone]
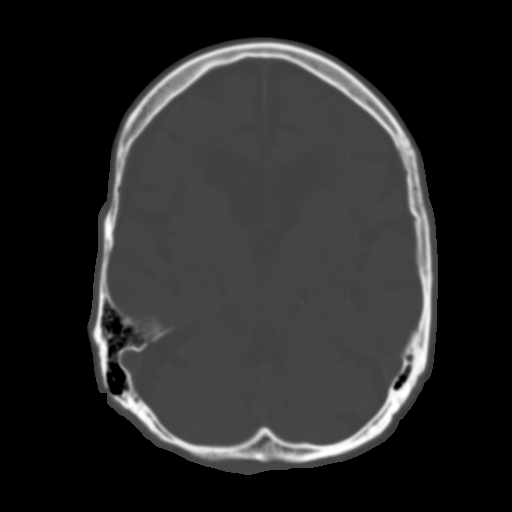
[im 20/36  brain]
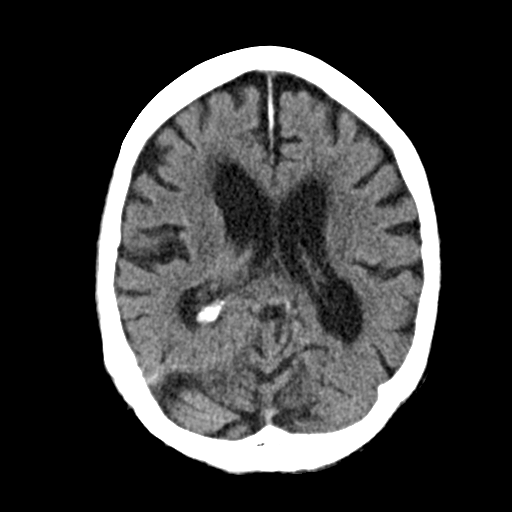
[im 23/36  brain]
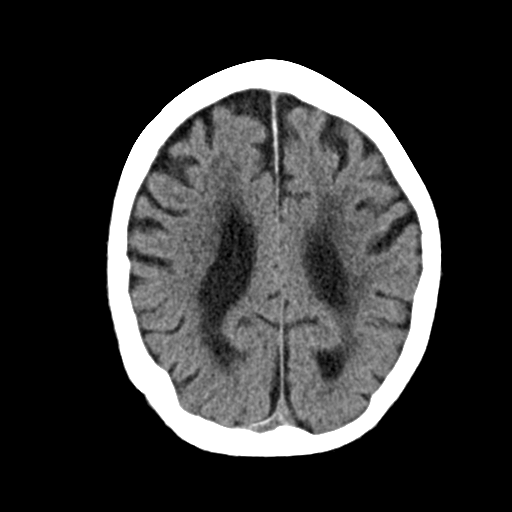
[im 27/36  brain]
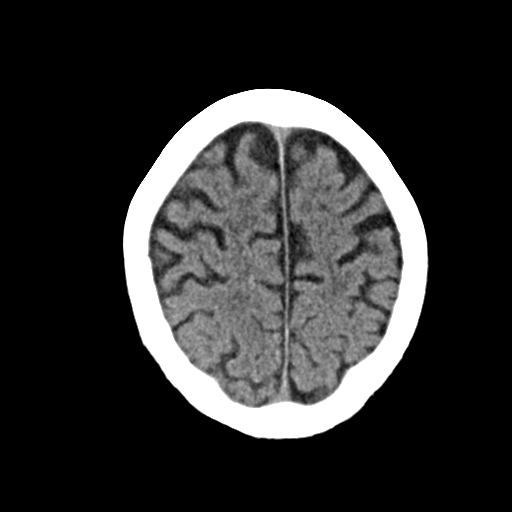
[im 29/36  brain]
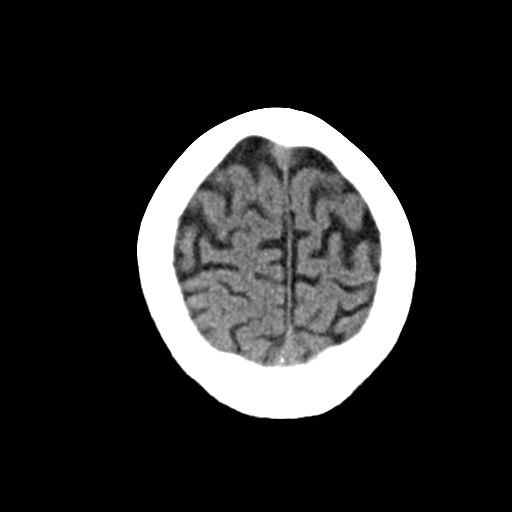
[im 29/36  bone]
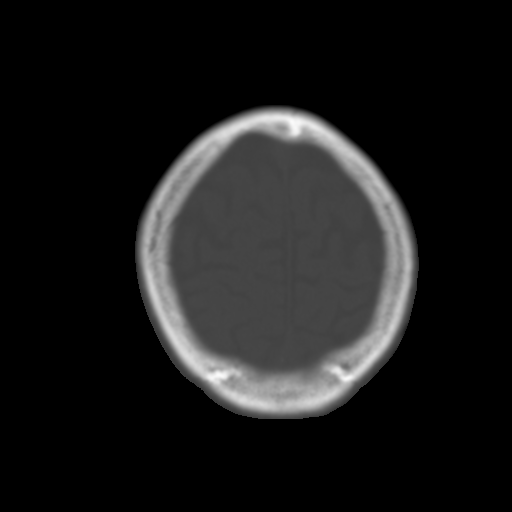
[im 33/36  brain]
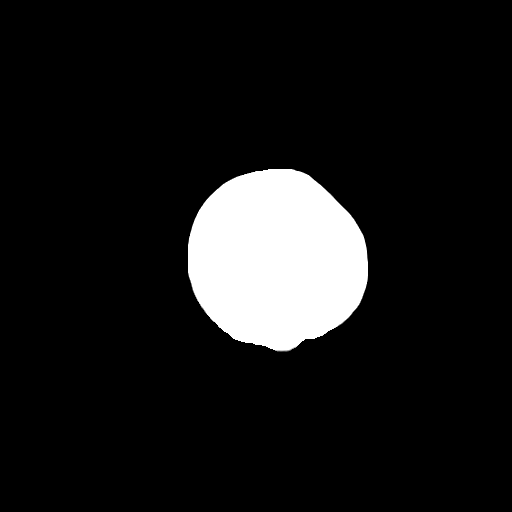

[Series 4: coronal soft · coronal · 0.32mm/px · 3 of 71 slices shown]
[im 24/71  brain]
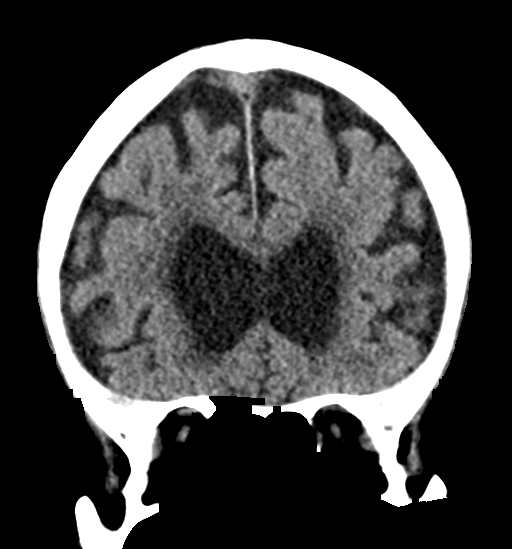
[im 32/71  brain]
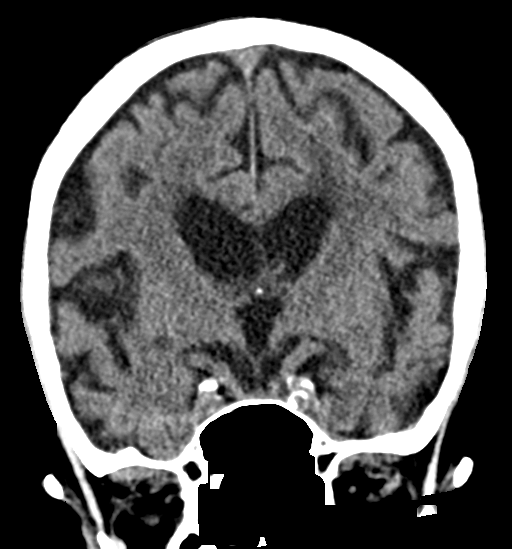
[im 39/71  brain]
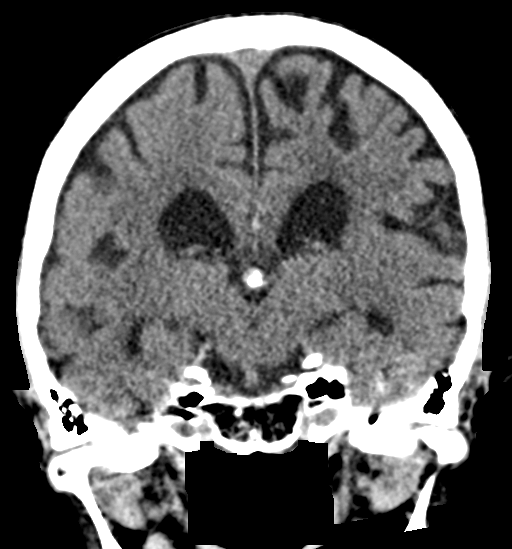

[Series 5: sag soft · sagittal · 0.34mm/px · 3 of 55 slices shown]
[im 22/55  brain]
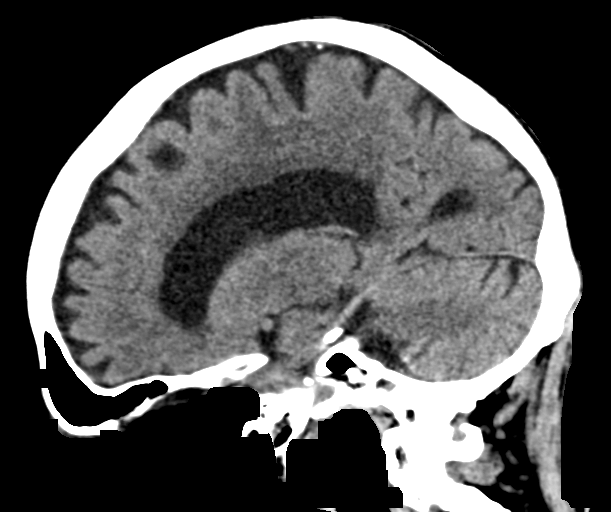
[im 28/55  brain]
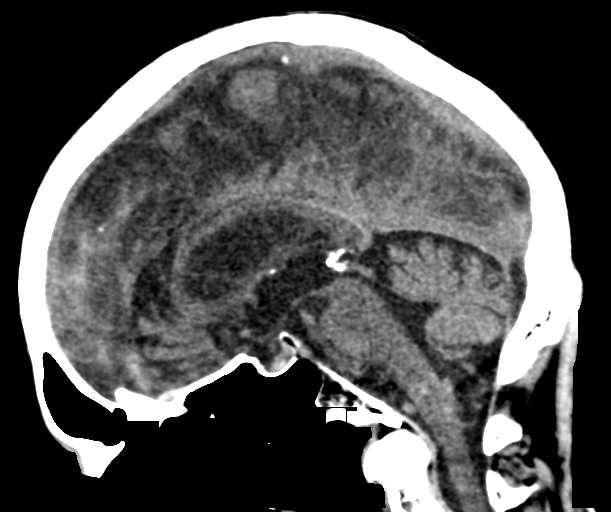
[im 34/55  brain]
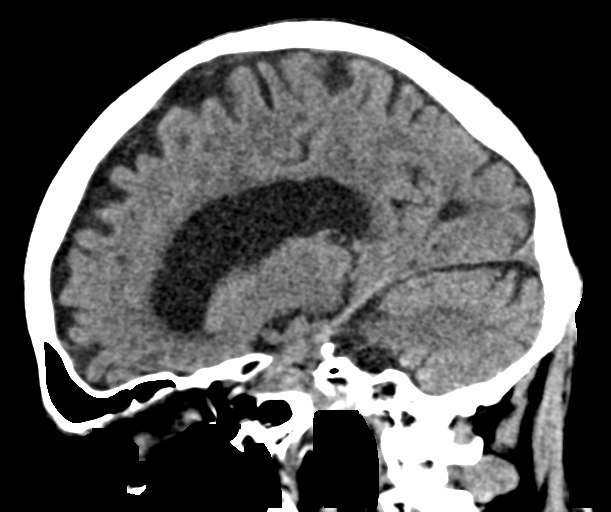

[16 of 47 positions shown; findings below may reference images not displayed]

FINDINGS: BRAIN: There is moderate stable sulcal and ventricular prominence
consistent with superficial and central atrophy. No intraparenchymal
hemorrhage, mass effect nor midline shift. Mild-to-moderate
periventricular and subcortical white matter hypodensities
consistent with chronic small vessel ischemic disease are
identified. No acute large vascular territory infarcts. No abnormal
extra-axial fluid collections. Chronic small basal ganglial lacunar
infarct on the right. Basal cisterns are not effaced and midline.

VASCULAR: Moderate calcific atherosclerosis of the carotid siphons.

SKULL: No skull fracture. No significant scalp soft tissue swelling.

SINUSES/ORBITS: The mastoid air-cells are clear. The included
paranasal sinuses are well-aerated.The included ocular globes and
orbital contents are non-suspicious.

OTHER: None
IMPRESSION: Chronic stable cerebral atrophy and small vessel ischemic disease.
No acute intracranial abnormality.
